# Patient Record
Sex: Male | Born: 1970 | Race: White | Hispanic: No | Marital: Married | Smoking: Never smoker
Health system: Southern US, Community
[De-identification: ages and names within clinical notes are randomized; demographics above are authoritative.]

## PROBLEM LIST (undated history)

## (undated) HISTORY — PX: HERNIA REPAIR: SHX51

## (undated) HISTORY — PX: APPENDECTOMY: SHX54

---

## 2021-05-13 ENCOUNTER — Inpatient Hospital Stay
Admission: EM | Admit: 2021-05-13 | Discharge: 2021-05-17 | DRG: 378 | Disposition: A | Discharge status: Home / Self Care | Payer: BC Managed Care – PPO | Attending: Internal Medicine | Admitting: Internal Medicine

## 2021-05-13 ENCOUNTER — Other Ambulatory Visit: Payer: Self-pay

## 2021-05-13 DIAGNOSIS — D123 Benign neoplasm of transverse colon: Secondary | ICD-10-CM | POA: Diagnosis present

## 2021-05-13 DIAGNOSIS — K5731 Diverticulosis of large intestine without perforation or abscess with bleeding: Secondary | ICD-10-CM | POA: Diagnosis not present

## 2021-05-13 DIAGNOSIS — Y838 Other surgical procedures as the cause of abnormal reaction of the patient, or of later complication, without mention of misadventure at the time of the procedure: Secondary | ICD-10-CM | POA: Diagnosis not present

## 2021-05-13 DIAGNOSIS — Z79899 Other long term (current) drug therapy: Secondary | ICD-10-CM

## 2021-05-13 DIAGNOSIS — K6289 Other specified diseases of anus and rectum: Secondary | ICD-10-CM

## 2021-05-13 DIAGNOSIS — I959 Hypotension, unspecified: Secondary | ICD-10-CM

## 2021-05-13 DIAGNOSIS — K922 Gastrointestinal hemorrhage, unspecified: Secondary | ICD-10-CM

## 2021-05-13 DIAGNOSIS — K91840 Postprocedural hemorrhage and hematoma of a digestive system organ or structure following a digestive system procedure: Secondary | ICD-10-CM

## 2021-05-13 DIAGNOSIS — Z20822 Contact with and (suspected) exposure to covid-19: Secondary | ICD-10-CM | POA: Diagnosis present

## 2021-05-13 DIAGNOSIS — K625 Hemorrhage of anus and rectum: Secondary | ICD-10-CM | POA: Diagnosis present

## 2021-05-13 DIAGNOSIS — Z9049 Acquired absence of other specified parts of digestive tract: Secondary | ICD-10-CM

## 2021-05-13 DIAGNOSIS — D125 Benign neoplasm of sigmoid colon: Secondary | ICD-10-CM | POA: Diagnosis present

## 2021-05-13 DIAGNOSIS — K644 Residual hemorrhoidal skin tags: Secondary | ICD-10-CM

## 2021-05-13 DIAGNOSIS — D62 Acute posthemorrhagic anemia: Secondary | ICD-10-CM | POA: Diagnosis not present

## 2021-05-13 DIAGNOSIS — K640 First degree hemorrhoids: Secondary | ICD-10-CM | POA: Diagnosis present

## 2021-05-13 DIAGNOSIS — K921 Melena: Secondary | ICD-10-CM | POA: Diagnosis not present

## 2021-05-13 DIAGNOSIS — Z6830 Body mass index (BMI) 30.0-30.9, adult: Secondary | ICD-10-CM

## 2021-05-13 DIAGNOSIS — E669 Obesity, unspecified: Secondary | ICD-10-CM | POA: Diagnosis present

## 2021-05-13 LAB — COMPREHENSIVE METABOLIC PANEL
ALT: 17 U/L (ref 0–44)
AST: 18 U/L (ref 15–41)
Albumin: 3.9 g/dL (ref 3.5–5.0)
Alkaline Phosphatase: 44 U/L (ref 38–126)
Anion gap: 5 (ref 5–15)
BUN: 17 mg/dL (ref 6–20)
CO2: 26 mmol/L (ref 22–32)
Calcium: 9.3 mg/dL (ref 8.9–10.3)
Chloride: 107 mmol/L (ref 98–111)
Creatinine, Ser: 0.95 mg/dL (ref 0.61–1.24)
GFR, Estimated: >60 mL/min (ref >60)
Glucose, Bld: 113 mg/dL — ABNORMAL HIGH (ref 70–99)
Potassium: 3.6 mmol/L (ref 3.5–5.1)
Sodium: 138 mmol/L (ref 135–145)
Total Bilirubin: 0.6 mg/dL (ref 0.3–1.2)
Total Protein: 6.2 g/dL — ABNORMAL LOW (ref 6.5–8.1)

## 2021-05-13 LAB — CBC
HCT: 40.5 % (ref 39.0–52.0)
Hemoglobin: 13.8 g/dL (ref 13.0–17.0)
MCH: 31.5 pg (ref 26.0–34.0)
MCHC: 34.1 g/dL (ref 30.0–36.0)
MCV: 92.5 fL (ref 80.0–100.0)
Platelets: 266 10*3/uL (ref 150–400)
RBC: 4.38 MIL/uL (ref 4.22–5.81)
RDW: 13 % (ref 11.5–15.5)
WBC: 9 10*3/uL (ref 4.0–10.5)
nRBC: 0 % (ref 0.0–0.2)

## 2021-05-13 MED ORDER — SODIUM CHLORIDE 0.9 % IV SOLN: INTRAVENOUS | Status: DC

## 2021-05-13 NOTE — ED Provider Notes (Signed)
[facility] Emergency Department Provider Note  ____________________________________________   Event Date/Time   First MD Initiated Contact with Patient 05/13/21 2258     (approximate)  I have reviewed the triage vital signs and the nursing notes.   HISTORY  Chief Complaint Abdominal Pain (PT COMPLAINING OF ABDOMINAL PAIN BELOW THE BELLY BUTTON, HAD 2 EPISODES OF BLEEDING TONIGHT 1 AT 9PM AND ONE AT 9:30 WAS ENOUGH TO TURN THE WATER IN THE STOOL DARK. NEVER HAPPENED BEFORE. EXCEPT A SMEAR FROM HEMORRHOIDS )    HPI Brian Wilson is a 50 y.o. male with no significant past medical history who presents to the emergency department GI bleeding that started tonight.  States he has had 2 episodes of maroon-colored bleeding with bowel movements tonight.  States that he felt very lightheaded, diaphoretic and was hypotensive with EMS with systolic pressures in the 90s.  Has been given IV fluids and reports feeling better.  States he is still having some abdominal pressure like he needs to have a bowel movement.  He had nausea but no vomiting.  No melena.  He is not on any blood thinners.  He has had scant amount of bleeding before from hemorrhoids but has never had a GI bleed.  Denies history of endoscopy or colonoscopy.  Denies fevers, chest pain, shortness of breath.  Has had previous appendectomy approximately 30 years ago.        History reviewed. No pertinent past medical history.  Patient Active Problem List   Diagnosis Date Noted   Rectal bleed 05/14/2021     History reviewed. No pertinent surgical history.  Prior to Admission medications   Medication Sig Start Date End Date Taking? Authorizing Provider  cholecalciferol (VITAMIN D) 25 MCG (1000 UNIT) tablet Take 1,000 Units by mouth daily.   Yes [provider]  Multiple Vitamin (MULTIVITAMIN WITH MINERALS) TABS tablet Take 1 tablet by mouth daily.   Yes [provider]  ZINC OXIDE PO  Take by mouth.   Yes [provider]    Allergies Patient has no known allergies.  History reviewed. No pertinent family history.  Social History Social History   Tobacco Use   Smoking status: Never   Smokeless tobacco: Never  Substance Use Topics   Alcohol use: Yes    Alcohol/week: 2.0 standard drinks    Types: 2 Glasses of wine per week    Comment: OCCAISIONALLY    Review of Systems Constitutional: No fever. Eyes: No visual changes. ENT: No sore throat. Cardiovascular: Denies chest pain. Respiratory: Denies shortness of breath. Gastrointestinal: No nausea, vomiting, diarrhea. Genitourinary: Negative for dysuria. Musculoskeletal: Negative for back pain. Skin: Negative for rash. Neurological: Negative for focal weakness or numbness.  ____________________________________________   PHYSICAL EXAM:  VITAL SIGNS: ED Triage Vitals  Enc Vitals Group     BP 05/13/21 2306 130/86     Pulse Rate 05/13/21 2306 86     Resp 05/13/21 2306 18     Temp 05/13/21 2306 98.2 F (36.8 C)     Temp Source 05/13/21 2306 Oral     SpO2 05/13/21 2306 97 %     Weight 05/13/21 2308 230 lb (104.3 kg)     Height 05/13/21 2308 '6\' 1"'$  (1.854 m)     Head Circumference --      Peak Flow --      Pain Score 05/13/21 2307 5     Pain Loc --      Pain Edu? --  Excl. in GC? --    CONSTITUTIONAL: Alert and oriented and responds appropriately to questions. Well-appearing; well-nourished HEAD: Normocephalic EYES: Conjunctivae clear, pupils appear equal, EOM appear intact ENT: normal nose; moist mucous membranes NECK: Supple, normal ROM CARD: RRR; S1 and S2 appreciated; no murmurs, no clicks, no rubs, no gallops RESP: Normal chest excursion without splinting or tachypnea; breath sounds clear and equal bilaterally; no wheezes, no rhonchi, no rales, no hypoxia or respiratory distress, speaking full sentences ABD/GI: Normal bowel sounds; non-distended; soft, mildly tender throughout the  lower abdomen, no rebound, no guarding, no peritoneal signs, no hepatosplenomegaly BACK: The back appears normal EXT: Normal ROM in all joints; no deformity noted, no edema; no cyanosis SKIN: Normal color for age and race; warm; no rash on exposed skin NEURO: Moves all extremities equally PSYCH: The patient's mood and manner are appropriate.  ____________________________________________   LABS (all labs ordered are listed, but only abnormal results are displayed)  Labs Reviewed  COMPREHENSIVE METABOLIC PANEL - Abnormal; Notable for the following components:      Result Value   Glucose, Bld 113 (*)    Total Protein 6.2 (*)    All other components within normal limits  RESP PANEL BY RT-PCR (FLU A&B, COVID) ARPGX2  CBC  PROTIME-INR  HIV ANTIBODY (ROUTINE TESTING W REFLEX)  HEMOGLOBIN AND HEMATOCRIT, BLOOD  HEMOGLOBIN AND HEMATOCRIT, BLOOD  TYPE AND SCREEN   ____________________________________________  EKG   EKG Interpretation  Date/Time:  Thursday May 13 2021 23:05:51 EDT Ventricular Rate:  74 PR Interval:  164 QRS Duration: 99 QT Interval:  370 QTC Calculation: 411 R Axis:   43 Text Interpretation: Sinus rhythm RSR' in V1 or V2, right VCD or RVH Confirmed by Pryor Curia [phone]) on 05/13/2021 11:16:41 PM        ____________________________________________  RADIOLOGY Jessie Foot Jumar Greenstreet, personally viewed and evaluated these images (plain radiographs) as part of my medical decision making, as well as reviewing the written report by the radiologist.  ED MD interpretation: CT scan shows possible area of bleeding at the diverticula in the ileum as well as hemorrhoids.  Official radiology report(s): CT Angio Abd/Pel W and/or Wo Contrast  Result Date: 05/14/2021 CLINICAL DATA:  GI bleeding, bilateral lower abdominal pain EXAM: CTA ABDOMEN AND PELVIS WITHOUT AND WITH CONTRAST TECHNIQUE: Multidetector CT imaging of the abdomen and pelvis was performed using the standard  protocol during bolus administration of intravenous contrast. Multiplanar reconstructed images and MIPs were obtained and reviewed to evaluate the vascular anatomy. CONTRAST:  167m OMNIPAQUE IOHEXOL 350 MG/ML SOLN COMPARISON:  None. FINDINGS: VASCULAR Aorta: Normal caliber aorta without aneurysm, dissection, vasculitis or significant stenosis. Celiac: Patent without evidence of aneurysm, dissection, vasculitis or significant stenosis. SMA: Patent. No evidence of aneurysm, dissection or vasculitis. No flow-limiting stenosis or occlusion. Renals: Single renal arteries bilaterally. Both renal arteries are patent without evidence of aneurysm, dissection, vasculitis, fibromuscular dysplasia or significant stenosis. IMA: Vessel normally opacified. No acute luminal abnormality. No significant stenosis or occlusion. No aneurysm or ectasia or features of vasculitis. Inflow: Patent without evidence of aneurysm, dissection, vasculitis or significant stenosis. Proximal Outflow: Bilateral common femoral and visualized portions of the superficial and profunda femoral arteries are patent without evidence of aneurysm, dissection, vasculitis or significant stenosis. Veins: No obvious venous abnormality within the limitations of this arterial phase study. Review of the MIP images confirms the above findings. NON-VASCULAR Lower chest: Atelectatic changes in the otherwise clear lung bases. Hepatobiliary: No worrisome focal liver lesions. Smooth liver  surface contour. Normal hepatic attenuation. Normal gallbladder and biliary tree. Pancreas: No pancreatic ductal dilatation or surrounding inflammatory changes. Spleen: Normal in size. No concerning splenic lesions. Adrenals/Urinary Tract: Normal adrenal glands. Kidneys are normally located with symmetric enhancement. No suspicious renal lesion, urolithiasis or hydronephrosis. Postsurgical changes and indentation of the right anterolateral bladder, likely related to a right inguinal  hernia repair. Stomach/Bowel: Distal esophagus, stomach and duodenal sweep are unremarkable. No small bowel wall thickening or dilatation. Few small distal ileal noninflamed diverticula. Small focus of arterial contrast enhancement associated with the right-sided cecal diverticulum July 16, 2061) which appears stable on the more delayed venous phase imaging, could reflect enhancement or minimal extravasation versus artifact due to adjacent bowel gas. Additional numerous colonic diverticula throughout the colonic segments without worrisome focal Peri diverticular inflammation colonic thickening or dilatation. Fluid-filled appearance of the colon. Serpentine hyperattenuation towards the level of the anorectal verge on the arterial phase of imaging 1997-07-16) appears less pronounced on the venous phase imaging. Appearance suggestive of hemorrhoidal enhancement with small site of extravasation less favored. Lymphatic: No significant vascular findings are present. No enlarged abdominal or pelvic lymph nodes. Reproductive: Vasectomy clips noted bilaterally. The prostate and seminal vesicles are unremarkable. Other: Postsurgical changes in the right inguinal region, possible hernia repair. Additional vertical midline incision anteriorly. Focal subcutaneous nodular thickening in the left flank, could reflect contusive change or injectable use. Musculoskeletal: Multilevel degenerative changes are present in the imaged portions of the spine, hips and pelvis. No acute osseous abnormality or suspicious osseous lesion. IMPRESSION: VASCULAR Two sites of questionable contrast blush including a small focus of arterial enhancement at the level of a cecal diverticulum persisting on the venous phase of imaging. Could reflect bleeding at a noninflamed diverticulum versus artifact due to adjacent bowel gas. Second site is is a more serpentine focus of enhancement diffusing on the venous phase imaging at the level of the anorectal verge. Such an  appearance is fairly typical for hemorrhoidal enhancement with small bleed less favored though could correlate with rectal exam. No other acute or conspicuous vascular findings. NON-VASCULAR Diffusely fluid-filled appearance of the colon, correlate for rapid transit state/diarrheal illness. Postsurgical changes in the right inguinal region with scarring about the right anterolateral bladder, correlate with surgical history for possible prior hernia repair. Prior vasectomy. Electronically Signed   By: Lovena Le M.D.   On: 05/14/2021 00:36    ____________________________________________   PROCEDURES  Procedure(s) performed (including Critical Care):  Procedures  CRITICAL CARE Performed by: Cyril Mourning Asuncion Tapscott   Total critical care time: 45 minutes  Critical care time was exclusive of separately billable procedures and treating other patients.  Critical care was necessary to treat or prevent imminent or life-threatening deterioration.  Critical care was time spent personally by me on the following activities: development of treatment plan with patient and/or surrogate as well as nursing, discussions with consultants, evaluation of patient's response to treatment, examination of patient, obtaining history from patient or surrogate, ordering and performing treatments and interventions, ordering and review of laboratory studies, ordering and review of radiographic studies, pulse oximetry and re-evaluation of patient's condition.  ____________________________________________   INITIAL IMPRESSION / ASSESSMENT AND PLAN / ED COURSE  As part of my medical decision making, I reviewed the following data within the [place] History obtained from family, Nursing notes reviewed and incorporated, Labs reviewed , EKG interpreted , Old chart reviewed, Radiograph reviewed , Discussed with admitting physician , and Notes from prior ED visits  Patient here with lower GI bleed.   Differential includes diverticulosis, diverticulitis, colitis, AVM, hemorrhoids.  Patient shows me pictures on his phone of a large amount of maroon-colored blood in the toilet bowl tonight without any obvious stool.  He was hypotensive with EMS but this has improved.  Tender in the lower abdomen but no guarding, rebound or peritoneal signs.  Will obtain labs, CTA of the abdomen and pelvis.  We will continue IV hydration.  Anticipate admission.  ED PROGRESS  Patient CT scan shows areas concerning for active bleeding the ileum where there are noninflamed diverticuli but also possible hemorrhoids as well.  He has had another bloody bowel movement here in the emergency department however remains hemodynamically stable.  Hemoglobin is normal.  No thrombocytopenia or coagulopathy.  Will discuss with medicine for admission.  1:27 AM  Discussed patient's case with hospitalist, Dr. Damita Dunnings.  I have recommended admission and patient (and family if present) agree with this plan. Admitting physician will place admission orders.   Hospitalist plans on consulting vascular surgery.  I reviewed all nursing notes, vitals, pertinent previous records and reviewed/interpreted all EKGs, lab and urine results, imaging (as available).  ____________________________________________   FINAL CLINICAL IMPRESSION(S) / ED DIAGNOSES  Final diagnoses:  Lower GI bleed  Hypotension, unspecified hypotension type     ED Discharge Orders     None       *Please note:  Darold Basta was evaluated in Emergency Department on 05/14/2021 for the symptoms described in the history of present illness. He was evaluated in the context of the global COVID-19 pandemic, which necessitated consideration that the patient might be at risk for infection with the SARS-CoV-2 virus that causes COVID-19. Institutional protocols and algorithms that pertain to the evaluation of patients at risk for COVID-19 are in a state of rapid change based  on information released by regulatory bodies including the CDC and federal and state organizations. These policies and algorithms were followed during the patient's care in the ED.  Some ED evaluations and interventions may be delayed as a result of limited staffing during and the pandemic.*   Note:  This document was prepared using Dragon voice recognition software and may include unintentional dictation errors.    Breckyn Troyer, Delice Bison, DO 05/14/21 [phone]

## 2021-05-13 NOTE — ED Triage Notes (Signed)
COMPLAINING OF HAVING LOWER ABDOMINAL PRESSURE LIKE HE IS GOING TO HAVE A BOWEL MOVEMENT

## 2021-05-13 NOTE — ED Notes (Signed)
LABS, DRAWN MD IN TO SEE PATIENT

## 2021-05-14 ENCOUNTER — Inpatient Hospital Stay: Payer: BC Managed Care – PPO | Admitting: Anesthesiology

## 2021-05-14 ENCOUNTER — Encounter: Payer: Self-pay | Admitting: Internal Medicine

## 2021-05-14 ENCOUNTER — Emergency Department: Payer: BC Managed Care – PPO

## 2021-05-14 ENCOUNTER — Encounter
Admission: EM | Disposition: A | Discharge status: Home / Self Care | Payer: Self-pay | Source: Home / Self Care | Attending: Internal Medicine

## 2021-05-14 DIAGNOSIS — Z6830 Body mass index (BMI) 30.0-30.9, adult: Secondary | ICD-10-CM | POA: Diagnosis not present

## 2021-05-14 DIAGNOSIS — K921 Melena: Secondary | ICD-10-CM | POA: Diagnosis present

## 2021-05-14 DIAGNOSIS — K625 Hemorrhage of anus and rectum: Secondary | ICD-10-CM | POA: Diagnosis not present

## 2021-05-14 DIAGNOSIS — D123 Benign neoplasm of transverse colon: Secondary | ICD-10-CM | POA: Diagnosis present

## 2021-05-14 DIAGNOSIS — Z79899 Other long term (current) drug therapy: Secondary | ICD-10-CM | POA: Diagnosis not present

## 2021-05-14 DIAGNOSIS — D62 Acute posthemorrhagic anemia: Secondary | ICD-10-CM | POA: Diagnosis not present

## 2021-05-14 DIAGNOSIS — Z20822 Contact with and (suspected) exposure to covid-19: Secondary | ICD-10-CM | POA: Diagnosis present

## 2021-05-14 DIAGNOSIS — E669 Obesity, unspecified: Secondary | ICD-10-CM | POA: Diagnosis present

## 2021-05-14 DIAGNOSIS — Z9049 Acquired absence of other specified parts of digestive tract: Secondary | ICD-10-CM | POA: Diagnosis not present

## 2021-05-14 DIAGNOSIS — K640 First degree hemorrhoids: Secondary | ICD-10-CM | POA: Diagnosis present

## 2021-05-14 DIAGNOSIS — Y838 Other surgical procedures as the cause of abnormal reaction of the patient, or of later complication, without mention of misadventure at the time of the procedure: Secondary | ICD-10-CM | POA: Diagnosis not present

## 2021-05-14 DIAGNOSIS — D125 Benign neoplasm of sigmoid colon: Secondary | ICD-10-CM | POA: Diagnosis present

## 2021-05-14 DIAGNOSIS — K922 Gastrointestinal hemorrhage, unspecified: Secondary | ICD-10-CM | POA: Diagnosis not present

## 2021-05-14 DIAGNOSIS — K635 Polyp of colon: Secondary | ICD-10-CM | POA: Diagnosis not present

## 2021-05-14 DIAGNOSIS — K644 Residual hemorrhoidal skin tags: Secondary | ICD-10-CM | POA: Diagnosis present

## 2021-05-14 DIAGNOSIS — I959 Hypotension, unspecified: Secondary | ICD-10-CM | POA: Diagnosis present

## 2021-05-14 DIAGNOSIS — K91840 Postprocedural hemorrhage and hematoma of a digestive system organ or structure following a digestive system procedure: Secondary | ICD-10-CM | POA: Diagnosis not present

## 2021-05-14 DIAGNOSIS — K5731 Diverticulosis of large intestine without perforation or abscess with bleeding: Secondary | ICD-10-CM | POA: Diagnosis present

## 2021-05-14 HISTORY — PX: COLONOSCOPY: SHX5424

## 2021-05-14 LAB — TYPE AND SCREEN
ABO/RH(D): O POS
Antibody Screen: NEGATIVE

## 2021-05-14 LAB — PROTIME-INR
INR: 1 (ref 0.8–1.2)
Prothrombin Time: 13.1 seconds (ref 11.4–15.2)

## 2021-05-14 LAB — RESP PANEL BY RT-PCR (FLU A&B, COVID) ARPGX2
Influenza A by PCR: NEGATIVE
Influenza B by PCR: NEGATIVE
SARS Coronavirus 2 by RT PCR: NEGATIVE

## 2021-05-14 LAB — HEMOGLOBIN AND HEMATOCRIT, BLOOD
HCT: 38.9 % — ABNORMAL LOW (ref 39.0–52.0)
HCT: 38 % — ABNORMAL LOW (ref 39.0–52.0)
Hemoglobin: 13.4 g/dL (ref 13.0–17.0)
Hemoglobin: 13 g/dL (ref 13.0–17.0)

## 2021-05-14 LAB — HIV ANTIBODY (ROUTINE TESTING W REFLEX): HIV Screen 4th Generation wRfx: NONREACTIVE

## 2021-05-14 SURGERY — COLONOSCOPY
Anesthesia: Monitor Anesthesia Care

## 2021-05-14 MED ORDER — PROPOFOL 10 MG/ML IV BOLUS
INTRAVENOUS | Status: DC | PRN
Start: 2021-05-14 — End: 2021-05-14
  Administered 2021-05-14: 100 mg via INTRAVENOUS
  Administered 2021-05-14: 40 mg via INTRAVENOUS
  Administered 2021-05-14: 60 mg via INTRAVENOUS
  Administered 2021-05-14 (×4): 40 mg via INTRAVENOUS

## 2021-05-14 MED ORDER — SODIUM CHLORIDE 0.9 % IV SOLN: INTRAVENOUS | Status: DC

## 2021-05-14 MED ORDER — ACETAMINOPHEN 325 MG PO TABS: 650.0000 mg | ORAL_TABLET | Freq: Four times a day (QID) | ORAL | Status: DC | PRN

## 2021-05-14 MED ORDER — PEG 3350-KCL-NA BICARB-NACL 420 G PO SOLR: 4000.0000 mL | Freq: Once | ORAL | Status: DC

## 2021-05-14 MED ORDER — IOHEXOL 350 MG/ML SOLN
100.0000 mL | Freq: Once | INTRAVENOUS | Status: AC | PRN
  Administered 2021-05-14: 100 mL via INTRAVENOUS

## 2021-05-14 MED ORDER — LIDOCAINE HCL (CARDIAC) PF 100 MG/5ML IV SOSY
PREFILLED_SYRINGE | INTRAVENOUS | Status: DC | PRN
  Administered 2021-05-14: 40 mg via INTRAVENOUS

## 2021-05-14 MED ORDER — PANTOPRAZOLE SODIUM 40 MG IV SOLR
40.0000 mg | INTRAVENOUS | Status: DC
  Administered 2021-05-14 – 2021-05-17 (×4): 40 mg via INTRAVENOUS
  Filled 2021-05-14 (×2): qty 40

## 2021-05-14 MED ORDER — ONDANSETRON HCL 4 MG/2ML IJ SOLN
4.0000 mg | Freq: Four times a day (QID) | INTRAMUSCULAR | Status: DC | PRN
  Administered 2021-05-14: 4 mg via INTRAVENOUS
  Filled 2021-05-14: qty 2

## 2021-05-14 MED ORDER — PEG 3350-KCL-NA BICARB-NACL 420 G PO SOLR
4000.0000 mL | Freq: Once | ORAL | Status: AC
  Administered 2021-05-14: 4000 mL via ORAL
  Filled 2021-05-14: qty 4000

## 2021-05-14 MED ORDER — PROPOFOL 500 MG/50ML IV EMUL
INTRAVENOUS | Status: AC
  Filled 2021-05-14: qty 50

## 2021-05-14 MED ORDER — PHENYLEPHRINE HCL (PRESSORS) 10 MG/ML IV SOLN
INTRAVENOUS | Status: DC | PRN
  Administered 2021-05-14 (×4): 200 ug via INTRAVENOUS
  Administered 2021-05-14: 100 ug via INTRAVENOUS
  Administered 2021-05-14: 150 ug via INTRAVENOUS

## 2021-05-14 MED ORDER — ACETAMINOPHEN 650 MG RE SUPP: 650.0000 mg | Freq: Four times a day (QID) | RECTAL | Status: DC | PRN

## 2021-05-14 MED ORDER — ONDANSETRON HCL 4 MG PO TABS: 4.0000 mg | ORAL_TABLET | Freq: Four times a day (QID) | ORAL | Status: DC | PRN

## 2021-05-14 NOTE — Anesthesia Postprocedure Evaluation (Signed)
Anesthesia Post Note  Patient: Brian Wilson  Procedure(s) Performed: COLONOSCOPY  Patient location during evaluation: PACU Anesthesia Type: MAC Level of consciousness: awake and alert Pain management: pain level controlled Vital Signs Assessment: post-procedure vital signs reviewed and stable Respiratory status: spontaneous breathing Cardiovascular status: blood pressure returned to baseline Postop Assessment: no headache Anesthetic complications: no   No notable events documented.   Last Vitals:  Vitals:   05/14/21 1330 05/14/21 1338  BP: 127/82 127/82  Pulse: 80 72  Resp: 18 15  Temp:    SpO2: 98% 99%    Last Pain:  Vitals:   05/14/21 1340  TempSrc:   PainSc: 0-No pain                 Milinda Pointer

## 2021-05-14 NOTE — H&P (Signed)
History and Physical    Brian Wilson [id number] DOB: 1971-02-27 DOA: 05/13/2021  PCP: Sallee Provencal, MD   Patient coming from: Home  I have personally briefly reviewed patient's old medical records in [place]  Chief Complaint: Rectal bleeding  HPI: Brian Wilson is a 50 y.o. male with a history of hemorrhoids who presents to the ED with onset of GI bleeding on the night of arrival.  Patient had 2 large maroon-colored bowel movements and became lightheaded and diaphoretic.  He denied shortness of breath palpitations or associated chest pain.  Denies abdominal pain but admits to feeling like he needs to have another bowel movement.  Denies nausea or vomiting.  He is not on anti-inflammatories nor taking blood thinners.  No prior upper or lower endoscopies.  He was previously in his usual state of health.  EMS reported systolic blood pressure in the 90s and he was bolused prior to arrival  ED course: By arrival BP 130/86 and pulse 86 with otherwise normal vitals.   Blood work with hemoglobin 13.8 and INR of 1, otherwise unremarkable EKG, personally viewed and interpreted: Sinus rhythm at 74 with nonspecific ST-T wave changes  Imaging: CT abdomen and pelvis with and without contrast with 2 questionable areas of contrast blush as described below:  Two sites of questionable contrast blush including a small focus of arterial enhancement at the level of a cecal diverticulum persisting on the venous phase of imaging. Could reflect bleeding at a noninflamed diverticulum versus artifact due to adjacent bowel gas.   Second site is is a more serpentine focus of enhancement diffusing on the venous phase imaging at the level of the anorectal verge. Such an appearance is fairly typical for hemorrhoidal enhancement with small bleed less favored though could correlate with rectal exam.  Patient was typed and crossed and given another fluid bolus.  Hospitalist consulted for  admission.  Review of Systems: As per HPI otherwise all other systems on review of systems negative.    History reviewed. No pertinent past medical history.  History reviewed. No pertinent surgical history.   reports that he has never smoked. He has never used smokeless tobacco. He reports current alcohol use of about 2.0 standard drinks of alcohol per week. No history on file for drug use.  No Known Allergies  History reviewed. No pertinent family history.    Prior to Admission medications   Medication Sig Start Date End Date Taking? Authorizing Provider  cholecalciferol (VITAMIN D) 25 MCG (1000 UNIT) tablet Take 1,000 Units by mouth daily.   Yes [provider]  Multiple Vitamin (MULTIVITAMIN WITH MINERALS) TABS tablet Take 1 tablet by mouth daily.   Yes [provider]  ZINC OXIDE PO Take by mouth.   Yes [provider]    Physical Exam: Vitals:   05/13/21 2306 05/13/21 2308 05/14/21 0000 05/14/21 0100  BP: 130/86  129/80 139/90  Pulse: 86  81 86  Resp: 18   18  Temp: 98.2 F (36.8 C)  98.2 F (36.8 C)   TempSrc: Oral     SpO2: 97%  98% 99%  Weight:  104.3 kg    Height:  '6\' 1"'$  (1.854 m)       Vitals:   05/13/21 2306 05/13/21 2308 05/14/21 0000 05/14/21 0100  BP: 130/86  129/80 139/90  Pulse: 86  81 86  Resp: 18   18  Temp: 98.2 F (36.8 C)  98.2 F (36.8 C)   TempSrc: Oral  SpO2: 97%  98% 99%  Weight:  104.3 kg    Height:  '6\' 1"'$  (1.854 m)        Constitutional: Alert and oriented x 3 . Not in any apparent distress HEENT:      Head: Normocephalic and atraumatic.         Eyes: PERLA, EOMI, Conjunctivae are normal. Sclera is non-icteric.       Mouth/Throat: Mucous membranes are moist.       Neck: Supple with no signs of meningismus. Cardiovascular: Regular rate and rhythm. No murmurs, gallops, or rubs. 2+ symmetrical distal pulses are present . No JVD. No LE edema Respiratory: Respiratory effort normal .Lungs sounds clear  bilaterally. No wheezes, crackles, or rhonchi.  Gastrointestinal: Soft, non tender, and non distended with positive bowel sounds.  Genitourinary: No CVA tenderness. Musculoskeletal: Nontender with normal range of motion in all extremities. No cyanosis, or erythema of extremities. Neurologic:  Face is symmetric. Moving all extremities. No gross focal neurologic deficits . Skin: Skin is warm, dry.  No rash or ulcers Psychiatric: Mood and affect are normal    Labs on Admission: I have personally reviewed following labs and imaging studies  CBC: Recent Labs  Lab 05/13/21 2311  WBC 9.0  HGB 13.8  HCT 40.5  MCV 92.5  PLT 123456   Basic Metabolic Panel: Recent Labs  Lab 05/13/21 2311  NA 138  K 3.6  CL 107  CO2 26  GLUCOSE 113*  BUN 17  CREATININE 0.95  CALCIUM 9.3   GFR: Estimated Creatinine Clearance: 120.6 mL/min (by C-G formula based on SCr of 0.95 mg/dL). Liver Function Tests: Recent Labs  Lab 05/13/21 2311  AST 18  ALT 17  ALKPHOS 44  BILITOT 0.6  PROT 6.2*  ALBUMIN 3.9   No results for input(s): LIPASE, AMYLASE in the last 168 hours. No results for input(s): AMMONIA in the last 168 hours. Coagulation Profile: Recent Labs  Lab 05/13/21 2311  INR 1.0   Cardiac Enzymes: No results for input(s): CKTOTAL, CKMB, CKMBINDEX, TROPONINI in the last 168 hours. BNP (last 3 results) No results for input(s): PROBNP in the last 8760 hours. HbA1C: No results for input(s): HGBA1C in the last 72 hours. CBG: No results for input(s): GLUCAP in the last 168 hours. Lipid Profile: No results for input(s): CHOL, HDL, LDLCALC, TRIG, CHOLHDL, LDLDIRECT in the last 72 hours. Thyroid Function Tests: No results for input(s): TSH, T4TOTAL, FREET4, T3FREE, THYROIDAB in the last 72 hours. Anemia Panel: No results for input(s): VITAMINB12, FOLATE, FERRITIN, TIBC, IRON, RETICCTPCT in the last 72 hours. Urine analysis: No results found for: COLORURINE, APPEARANCEUR, LABSPEC, PHURINE,  GLUCOSEU, HGBUR, BILIRUBINUR, KETONESUR, PROTEINUR, UROBILINOGEN, NITRITE, LEUKOCYTESUR  Radiological Exams on Admission: CT Angio Abd/Pel W and/or Wo Contrast  Result Date: 05/14/2021 CLINICAL DATA:  GI bleeding, bilateral lower abdominal pain EXAM: CTA ABDOMEN AND PELVIS WITHOUT AND WITH CONTRAST TECHNIQUE: Multidetector CT imaging of the abdomen and pelvis was performed using the standard protocol during bolus administration of intravenous contrast. Multiplanar reconstructed images and MIPs were obtained and reviewed to evaluate the vascular anatomy. CONTRAST:  131m OMNIPAQUE IOHEXOL 350 MG/ML SOLN COMPARISON:  None. FINDINGS: VASCULAR Aorta: Normal caliber aorta without aneurysm, dissection, vasculitis or significant stenosis. Celiac: Patent without evidence of aneurysm, dissection, vasculitis or significant stenosis. SMA: Patent. No evidence of aneurysm, dissection or vasculitis. No flow-limiting stenosis or occlusion. Renals: Single renal arteries bilaterally. Both renal arteries are patent without evidence of aneurysm, dissection, vasculitis, fibromuscular dysplasia or significant  stenosis. IMA: Vessel normally opacified. No acute luminal abnormality. No significant stenosis or occlusion. No aneurysm or ectasia or features of vasculitis. Inflow: Patent without evidence of aneurysm, dissection, vasculitis or significant stenosis. Proximal Outflow: Bilateral common femoral and visualized portions of the superficial and profunda femoral arteries are patent without evidence of aneurysm, dissection, vasculitis or significant stenosis. Veins: No obvious venous abnormality within the limitations of this arterial phase study. Review of the MIP images confirms the above findings. NON-VASCULAR Lower chest: Atelectatic changes in the otherwise clear lung bases. Hepatobiliary: No worrisome focal liver lesions. Smooth liver surface contour. Normal hepatic attenuation. Normal gallbladder and biliary tree. Pancreas:  No pancreatic ductal dilatation or surrounding inflammatory changes. Spleen: Normal in size. No concerning splenic lesions. Adrenals/Urinary Tract: Normal adrenal glands. Kidneys are normally located with symmetric enhancement. No suspicious renal lesion, urolithiasis or hydronephrosis. Postsurgical changes and indentation of the right anterolateral bladder, likely related to a right inguinal hernia repair. Stomach/Bowel: Distal esophagus, stomach and duodenal sweep are unremarkable. No small bowel wall thickening or dilatation. Few small distal ileal noninflamed diverticula. Small focus of arterial contrast enhancement associated with the right-sided cecal diverticulum 07/12/61) which appears stable on the more delayed venous phase imaging, could reflect enhancement or minimal extravasation versus artifact due to adjacent bowel gas. Additional numerous colonic diverticula throughout the colonic segments without worrisome focal Peri diverticular inflammation colonic thickening or dilatation. Fluid-filled appearance of the colon. Serpentine hyperattenuation towards the level of the anorectal verge on the arterial phase of imaging Jul 12, 1997) appears less pronounced on the venous phase imaging. Appearance suggestive of hemorrhoidal enhancement with small site of extravasation less favored. Lymphatic: No significant vascular findings are present. No enlarged abdominal or pelvic lymph nodes. Reproductive: Vasectomy clips noted bilaterally. The prostate and seminal vesicles are unremarkable. Other: Postsurgical changes in the right inguinal region, possible hernia repair. Additional vertical midline incision anteriorly. Focal subcutaneous nodular thickening in the left flank, could reflect contusive change or injectable use. Musculoskeletal: Multilevel degenerative changes are present in the imaged portions of the spine, hips and pelvis. No acute osseous abnormality or suspicious osseous lesion. IMPRESSION: VASCULAR Two sites of  questionable contrast blush including a small focus of arterial enhancement at the level of a cecal diverticulum persisting on the venous phase of imaging. Could reflect bleeding at a noninflamed diverticulum versus artifact due to adjacent bowel gas. Second site is is a more serpentine focus of enhancement diffusing on the venous phase imaging at the level of the anorectal verge. Such an appearance is fairly typical for hemorrhoidal enhancement with small bleed less favored though could correlate with rectal exam. No other acute or conspicuous vascular findings. NON-VASCULAR Diffusely fluid-filled appearance of the colon, correlate for rapid transit state/diarrheal illness. Postsurgical changes in the right inguinal region with scarring about the right anterolateral bladder, correlate with surgical history for possible prior hernia repair. Prior vasectomy. Electronically Signed   By: Lovena Le M.D.   On: 05/14/2021 00:36     Assessment/Plan 50 year old male with history of hemorrhoids who p presenting with onset of large maroon-colored bowel movements associated with lightheadedness and diaphoresis with EMS recording systolic BP in the 0000000.    Rectal bleeding/hematochezia History of hemorrhoids Hypotension with EMS - Hypotension with EMS was fluid responsive - Hemoglobin 13.8 with INR of 1.0 - No history of blood thinners or NSAIDs - CTA showing 2 areas of contrast blush suggesting possible rectal bleeding diverticulum versus hemorrhoidal - Discussed with vascular Dr Lucky Cowboy who opines that bleeding  site appears to be more hemorrhoidal - Discussed with GI, Dr Bonna Gains who would like  to start bowel prep for colonoscopy in the am, NPO after 10 am - Colonoscopy prep ordered and communicated to nurse - Continue serial H&H -Patient was given a dose of Protonix IV - Continue hemodynamic monitoring  DVT prophylaxis: SCD Code Status: full code  Family Communication:  none  Disposition Plan: Back to  previous home environment Consults called: vascular and GI  Status:At the time of admission, it appears that the appropriate admission status for this patient is INPATIENT. This is judged to be reasonable and necessary in order to provide the required intensity of service to ensure the patient's safety given the presenting symptoms, physical exam findings, and initial radiographic and laboratory data in the context of their  Comorbid conditions.   Patient requires inpatient status due to high intensity of service, high risk for further deterioration and high frequency of surveillance required.   I certify that at the point of admission it is my clinical judgment that the patient will require inpatient hospital care spanning beyond [place] MD [facility]     05/14/2021, 1:58 AM

## 2021-05-14 NOTE — Transfer of Care (Signed)
Immediate Anesthesia Transfer of Care Note  Patient: Brian Wilson  Procedure(s) Performed: COLONOSCOPY  Patient Location: Endoscopy Unit  Anesthesia Type:MAC  Level of Consciousness: awake, alert  and oriented  Airway & Oxygen Therapy: Patient Spontanous Breathing and Patient connected to nasal cannula oxygen  Post-op Assessment: Report given to RN, Post -op Vital signs reviewed and stable and Patient moving all extremities  Post vital signs: Reviewed and stable  Last Vitals:  Vitals Value Taken Time  BP 117/72 05/14/21 1320  Temp    Pulse 88 05/14/21 1321  Resp 17 05/14/21 1321  SpO2 99 % 05/14/21 1321  Vitals shown include unvalidated device data.  Last Pain:  Vitals:   05/14/21 1154  TempSrc: Temporal  PainSc: 0-No pain      Patients Stated Pain Goal: 2 (50/93/26 7124)  Complications: No notable events documented.

## 2021-05-14 NOTE — Consult Note (Signed)
[facility] Consult Note  MRN : [id number]  Brian Wilson is a 50 y.o. (1971-09-12) male who presents with chief complaint of  Chief Complaint  Patient presents with   Abdominal Pain    PT COMPLAINING OF ABDOMINAL PAIN BELOW THE BELLY BUTTON, HAD 2 EPISODES OF BLEEDING TONIGHT 1 AT 9PM AND ONE AT 9:30 WAS ENOUGH TO TURN THE WATER IN THE STOOL DARK. NEVER HAPPENED BEFORE. EXCEPT A SMEAR FROM HEMORRHOIDS    History of Present Illness:  Brian Wilson is a 50 year old male with a history of hemorrhoids who presents to the ED with onset of GI bleeding on the night of arrival.    Patient had two large maroon-colored bowel movements and became lightheaded and diaphoretic. He denied shortness of breath palpitations or associated chest pain. Denies abdominal pain but admits to feeling like he needs to have another bowel movement.  Denies nausea or vomiting. He is not on anti-inflammatories nor taking blood thinners. No prior upper or lower endoscopies. He was previously in his usual state of health. EMS reported systolic blood pressure in the 90s and he was bolused prior to arrival   Imaging: CT abdomen and pelvis with and without contrast with 2 questionable areas of contrast blush as described below  Vascular Surgery was consulted by Dr. Damita Dunnings for possible endovascular intervention.   Current Facility-Administered Medications  Medication Dose Route Frequency Provider Last Rate Last Admin   0.9 %  sodium chloride infusion   Intravenous Continuous Athena Masse, MD 125 mL/hr at 05/14/21 0506 New Bag at 05/14/21 0506   0.9 %  sodium chloride infusion   Intravenous Continuous Athena Masse, MD 20 mL/hr at 05/14/21 1157 New Bag at 05/14/21 1157   [MAR Hold] acetaminophen (TYLENOL) tablet 650 mg  650 mg Oral Q6H PRN Athena Masse, MD       Or   Doug Sou Hold] acetaminophen (TYLENOL) suppository 650 mg  650 mg Rectal Q6H PRN Athena Masse, MD       [MAR Hold]  ondansetron [facility]) tablet 4 mg  4 mg Oral Q6H PRN Athena Masse, MD       Or   Doug Sou Hold] ondansetron [facility]) injection 4 mg  4 mg Intravenous Q6H PRN Athena Masse, MD   4 mg at 05/14/21 0934   [MAR Hold] pantoprazole (PROTONIX) injection 40 mg  40 mg Intravenous Q24H Athena Masse, MD   40 mg at 05/14/21 0548   History reviewed. No pertinent past medical history.  Past Surgical History:  Procedure Laterality Date   APPENDECTOMY     HERNIA REPAIR     Social History Social History   Tobacco Use   Smoking status: Never   Smokeless tobacco: Never  Substance Use Topics   Alcohol use: Yes    Alcohol/week: 2.0 standard drinks    Types: 2 Glasses of wine per week    Comment: OCCAISIONALLY   Family History History reviewed. No pertinent family history. Denies family history of PAD, venous or bleeding / clotting disorders.  No Known Allergies  REVIEW OF SYSTEMS (Negative unless checked)  Constitutional: '[]'$ Weight loss  '[]'$ Fever  '[]'$ Chills Cardiac: '[]'$ Chest pain   '[]'$ Chest pressure   '[]'$ Palpitations   '[]'$ Shortness of breath when laying flat   '[]'$ Shortness of breath at rest   '[]'$ Shortness of breath with exertion. Vascular:  '[]'$ Pain in legs with walking   '[]'$ Pain in legs at rest   '[]'$ Pain in legs when laying  flat   '[]'$ Claudication   '[]'$ Pain in feet when walking  '[]'$ Pain in feet at rest  '[]'$ Pain in feet when laying flat   '[]'$ History of DVT   '[]'$ Phlebitis   '[]'$ Swelling in legs   '[]'$ Varicose veins   '[]'$ Non-healing ulcers Pulmonary:   '[]'$ Uses home oxygen   '[]'$ Productive cough   '[]'$ Hemoptysis   '[]'$ Wheeze  '[]'$ COPD   '[]'$ Asthma Neurologic:  '[]'$ Dizziness  '[]'$ Blackouts   '[]'$ Seizures   '[]'$ History of stroke   '[]'$ History of TIA  '[]'$ Aphasia   '[]'$ Temporary blindness   '[]'$ Dysphagia   '[]'$ Weakness or numbness in arms   '[]'$ Weakness or numbness in legs Musculoskeletal:  '[]'$ Arthritis   '[]'$ Joint swelling   '[]'$ Joint pain   '[]'$ Low back pain Hematologic:  '[]'$ Easy bruising  '[]'$ Easy bleeding   '[]'$ Hypercoagulable state   '[]'$ Anemic   '[]'$ Hepatitis Gastrointestinal:  '[]'$ Blood in stool   '[]'$ Vomiting blood  '[]'$ Gastroesophageal reflux/heartburn   '[]'$ Difficulty swallowing. Genitourinary:  '[]'$ Chronic kidney disease   '[]'$ Difficult urination  '[]'$ Frequent urination  '[]'$ Burning with urination   '[]'$ Blood in urine Skin:  '[]'$ Rashes   '[]'$ Ulcers   '[]'$ Wounds Psychological:  '[]'$ History of anxiety   '[]'$  History of major depression.  Positive for blood in stool and hemorrhoids   Physical Examination  Vitals:   05/14/21 0500 05/14/21 0600 05/14/21 0933 05/14/21 1154  BP: 112/72 126/75 122/80 (!) 137/92  Pulse: 77 67 79 75  Resp: '17 14 18 18  '$ Temp:   97.7 F (36.5 C) (!) 96.7 F (35.9 C)  TempSrc:   Oral Temporal  SpO2: 97% 99% 99% 100%  Weight:    104.3 kg  Height:    '6\' 1"'$  (1.854 m)   Body mass index is 30.34 kg/m. Gen:  WD/WN, NAD Head: [place]/AT, No temporalis wasting. Prominent temp pulse not noted. Ear/Nose/Throat: Hearing grossly intact, nares w/o erythema or drainage, oropharynx w/o Erythema/Exudate Eyes: Sclera non-icteric, conjunctiva clear Neck: Trachea midline.  No JVD.  Pulmonary:  Good air movement, respirations not labored, equal bilaterally.  Cardiac: RRR, normal S1, S2. Vascular:  Vessel Right Left  Radial Palpable Palpable  Ulnar Palpable Palpable  Brachial Palpable Palpable  Carotid Palpable, without bruit Palpable, without bruit  Aorta Not palpable N/A  Femoral Palpable Palpable  Popliteal Palpable Palpable  PT Palpable Palpable  DP Palpable Palpable   Gastrointestinal: soft, non-tender/non-distended. No guarding/reflex.  Musculoskeletal: M/S 5/5 throughout.  Extremities without ischemic changes.  No deformity or atrophy. No edema. Neurologic: Sensation grossly intact in extremities.  Symmetrical.  Speech is fluent. Motor exam as listed above. Psychiatric: Judgment intact, Mood & affect appropriate for pt's clinical situation. Dermatologic: No rashes or ulcers noted.  No cellulitis or open wounds. Lymph : No Cervical,  Axillary, or Inguinal lymphadenopathy.  CBC Lab Results  Component Value Date   WBC 9.0 05/13/2021   HGB 13.4 05/14/2021   HCT 38.9 (L) 05/14/2021   MCV 92.5 05/13/2021   PLT 266 05/13/2021   BMET    Component Value Date/Time   NA 138 05/13/2021 2311   K 3.6 05/13/2021 2311   CL 107 05/13/2021 2311   CO2 26 05/13/2021 2311   GLUCOSE 113 (H) 05/13/2021 2311   BUN 17 05/13/2021 2311   CREATININE 0.95 05/13/2021 2311   CALCIUM 9.3 05/13/2021 2311   GFRNONAA >60 05/13/2021 2311   Estimated Creatinine Clearance: 120.6 mL/min (by C-G formula based on SCr of 0.95 mg/dL).  COAG Lab Results  Component Value Date   INR 1.0 05/13/2021   Radiology CT Angio Abd/Pel W and/or Wo Contrast  Result Date: 05/14/2021 CLINICAL DATA:  GI bleeding, bilateral lower abdominal pain EXAM: CTA ABDOMEN AND PELVIS WITHOUT AND WITH CONTRAST TECHNIQUE: Multidetector CT imaging of the abdomen and pelvis was performed using the standard protocol during bolus administration of intravenous contrast. Multiplanar reconstructed images and MIPs were obtained and reviewed to evaluate the vascular anatomy. CONTRAST:  129m OMNIPAQUE IOHEXOL 350 MG/ML SOLN COMPARISON:  None. FINDINGS: VASCULAR Aorta: Normal caliber aorta without aneurysm, dissection, vasculitis or significant stenosis. Celiac: Patent without evidence of aneurysm, dissection, vasculitis or significant stenosis. SMA: Patent. No evidence of aneurysm, dissection or vasculitis. No flow-limiting stenosis or occlusion. Renals: Single renal arteries bilaterally. Both renal arteries are patent without evidence of aneurysm, dissection, vasculitis, fibromuscular dysplasia or significant stenosis. IMA: Vessel normally opacified. No acute luminal abnormality. No significant stenosis or occlusion. No aneurysm or ectasia or features of vasculitis. Inflow: Patent without evidence of aneurysm, dissection, vasculitis or significant stenosis. Proximal Outflow: Bilateral common  femoral and visualized portions of the superficial and profunda femoral arteries are patent without evidence of aneurysm, dissection, vasculitis or significant stenosis. Veins: No obvious venous abnormality within the limitations of this arterial phase study. Review of the MIP images confirms the above findings. NON-VASCULAR Lower chest: Atelectatic changes in the otherwise clear lung bases. Hepatobiliary: No worrisome focal liver lesions. Smooth liver surface contour. Normal hepatic attenuation. Normal gallbladder and biliary tree. Pancreas: No pancreatic ductal dilatation or surrounding inflammatory changes. Spleen: Normal in size. No concerning splenic lesions. Adrenals/Urinary Tract: Normal adrenal glands. Kidneys are normally located with symmetric enhancement. No suspicious renal lesion, urolithiasis or hydronephrosis. Postsurgical changes and indentation of the right anterolateral bladder, likely related to a right inguinal hernia repair. Stomach/Bowel: Distal esophagus, stomach and duodenal sweep are unremarkable. No small bowel wall thickening or dilatation. Few small distal ileal noninflamed diverticula. Small focus of arterial contrast enhancement associated with the right-sided cecal diverticulum (July 06, 2061 which appears stable on the more delayed venous phase imaging, could reflect enhancement or minimal extravasation versus artifact due to adjacent bowel gas. Additional numerous colonic diverticula throughout the colonic segments without worrisome focal Peri diverticular inflammation colonic thickening or dilatation. Fluid-filled appearance of the colon. Serpentine hyperattenuation towards the level of the anorectal verge on the arterial phase of imaging (1997/07/06 appears less pronounced on the venous phase imaging. Appearance suggestive of hemorrhoidal enhancement with small site of extravasation less favored. Lymphatic: No significant vascular findings are present. No enlarged abdominal or pelvic lymph  nodes. Reproductive: Vasectomy clips noted bilaterally. The prostate and seminal vesicles are unremarkable. Other: Postsurgical changes in the right inguinal region, possible hernia repair. Additional vertical midline incision anteriorly. Focal subcutaneous nodular thickening in the left flank, could reflect contusive change or injectable use. Musculoskeletal: Multilevel degenerative changes are present in the imaged portions of the spine, hips and pelvis. No acute osseous abnormality or suspicious osseous lesion. IMPRESSION: VASCULAR Two sites of questionable contrast blush including a small focus of arterial enhancement at the level of a cecal diverticulum persisting on the venous phase of imaging. Could reflect bleeding at a noninflamed diverticulum versus artifact due to adjacent bowel gas. Second site is is a more serpentine focus of enhancement diffusing on the venous phase imaging at the level of the anorectal verge. Such an appearance is fairly typical for hemorrhoidal enhancement with small bleed less favored though could correlate with rectal exam. No other acute or conspicuous vascular findings. NON-VASCULAR Diffusely fluid-filled appearance of the colon, correlate for rapid transit state/diarrheal illness. Postsurgical changes in the right inguinal  region with scarring about the right anterolateral bladder, correlate with surgical history for possible prior hernia repair. Prior vasectomy. Electronically Signed   By: Lovena Le M.D.   On: 05/14/2021 00:36    Assessment/Plan The patient is a 50 year old male with a past medical hx of hemorrhoids who presented to the ED after experiencing a "bloody" bowel movement   1. Possible Lower GI Bleed vs Hemorrhoids: No abdominal pain.  Hbg this AM (13.4) from (13.8) last night.  Creatinine normal. Vitals stable, no hypotension. GI plans on scoping the patient today. Pending their findings. At this point, there is not indication for embolization unless  active bleeding is noted / the patient becomes unstable / target is found.  Vascular to continue to follow along.  2. Obesity: Complicated any procedure Encouraged weight loss  Discussed with Dr. Mayme Genta, PA-C  05/14/2021 12:10 PM  This note was created with Dragon medical transcription system.  Any error is purely unintentional.

## 2021-05-14 NOTE — Anesthesia Preprocedure Evaluation (Signed)
Anesthesia Evaluation  Patient identified by MRN, date of birth, ID band Patient awake    History of Anesthesia Complications Negative for: history of anesthetic complications  Airway Mallampati: II  TM Distance: >3 FB Neck ROM: Full    Dental no notable dental hx.    Pulmonary neg pulmonary ROS,    Pulmonary exam normal - rhonchi (-) wheezing      Cardiovascular negative cardio ROS Normal cardiovascular exam Rhythm:Regular Rate:Normal - Systolic murmurs and - Diastolic murmurs    Neuro/Psych    GI/Hepatic Neg liver ROS, ED on 7/28 with rectal bleeding - new onset GIB  05/14/21 CT Abd/Pelvis: IMPRESSION: VASCULAR  Two sites of questionable contrast blush including a small focus of arterial enhancement at the level of a cecal diverticulum persisting on the venous phase of imaging. Could reflect bleeding at a noninflamed diverticulum versus artifact due to adjacent bowel gas.  Second site is is a more serpentine focus of enhancement diffusing on the venous phase imaging at the level of the anorectal verge. Such an appearance is fairly typical for hemorrhoidal enhancement with small bleed less favored though could correlate with rectal exam.  No other acute or conspicuous vascular findings.  NON-VASCULAR  Diffusely fluid-filled appearance of the colon, correlate for rapid transit state/diarrheal illness.  Postsurgical changes in the right inguinal region with scarring about the right anterolateral bladder, correlate with surgical history for possible prior hernia repair.  Prior vasectomy.   Endo/Other  negative endocrine ROS  Renal/GU negative Renal ROS  negative genitourinary   Musculoskeletal negative musculoskeletal ROS (+)   Abdominal Normal abdominal exam  (+)   Peds negative pediatric ROS (+)  Hematology negative hematology ROS (+)   Anesthesia Other Findings   Reproductive/Obstetrics                              Anesthesia Physical Anesthesia Plan  ASA: 2 and emergent  Anesthesia Plan: MAC   Post-op Pain Management:    Induction:   PONV Risk Score and Plan: 0 and Propofol infusion and TIVA  Airway Management Planned: Mask and Natural Airway  Additional Equipment:   Intra-op Plan:   Post-operative Plan:   Informed Consent: I have reviewed the patients History and Physical, chart, labs and discussed the procedure including the risks, benefits and alternatives for the proposed anesthesia with the patient or authorized representative who has indicated his/her understanding and acceptance.     Dental advisory given  Plan Discussed with: CRNA and Anesthesiologist  Anesthesia Plan Comments: (IVGA, PIV x 1, ASA SM, RBO discussed, Consent signed.)        Anesthesia Quick Evaluation

## 2021-05-14 NOTE — Consult Note (Signed)
Brian Wilson , MD [address], [place], [place], [place], [address] [address], [place], [place], [place], [address] Phone: [phone]  Fax: [phone]  Consultation  Referring Provider:     Dr Damita Dunnings Primary Care Physician:  Sallee Provencal, MD Primary Gastroenterologist:  None          Reason for Consultation:     GI bleed  Date of Admission:  05/13/2021 Date of Consultation:  05/14/2021         HPI:   Brian Wilson is a 50 y.o. male presented to the emergency room with rectal bleeding.  History of hemorrhoids.  EMS reported blood pressures with systolics in the 0000000.  Hemoglobin was 13.8 on admission.  He had a CT abdomen and pelvis with contrast on the concern for a blush at the level of a fecal diverticulum and a second site at the anorectal verge.  The patient was discussed with Dr. Marin Shutter and he felt it was more hemorrhoidal in nature.  Patient was commenced on bowel prep with a plan to do a colonoscopy this morning.   Hb this morning at 5 am was stable at 13.4 grams.No prior colonoscopy .   Says had atleast 10 episodes of bright red blood per rectum since last night , every time he went to have a bowel movement was bloody. No bleeding noted after the bowel prep . No prior colonoscopy , no famioly history of colon cancer or polyps. No weight loss. Feels well presently.  History reviewed. No pertinent past medical history.  History reviewed. No pertinent surgical history.  Prior to Admission medications   Medication Sig Start Date End Date Taking? Authorizing Provider  cholecalciferol (VITAMIN D) 25 MCG (1000 UNIT) tablet Take 1,000 Units by mouth daily.   Yes [provider]  Multiple Vitamin (MULTIVITAMIN WITH MINERALS) TABS tablet Take 1 tablet by mouth daily.   Yes [provider]  ZINC OXIDE PO Take by mouth.   Yes [provider]    History reviewed. No pertinent family history.   Social History   Tobacco Use  . Smoking status:  Never  . Smokeless tobacco: Never  Substance Use Topics  . Alcohol use: Yes    Alcohol/week: 2.0 standard drinks    Types: 2 Glasses of wine per week    Comment: OCCAISIONALLY    Allergies as of 05/13/2021  . (Not on File)    Review of Systems:    All systems reviewed and negative except where noted in HPI.   Physical Exam:  Vital signs in last 24 hours: Temp:  [98.2 F (36.8 C)] 98.2 F (36.8 C) (07/29 0000) Pulse Rate:  [67-90] 67 (07/29 0600) Resp:  [14-18] 14 (07/29 0600) BP: (112-139)/(68-90) 126/75 (07/29 0600) SpO2:  [93 %-99 %] 99 % (07/29 0600) Weight:  [104.3 kg] 104.3 kg (07/28 2308)   General:   Pleasant, cooperative in NAD Head:  Normocephalic and atraumatic. Eyes:   No icterus.   Conjunctiva pink. PERRLA. Ears:  Normal auditory acuity. Neck:  Supple; no masses or thyroidomegaly Lungs: Respirations even and unlabored. Lungs clear to auscultation bilaterally.   No wheezes, crackles, or rhonchi.  Heart:  Regular rate and rhythm;  Without murmur, clicks, rubs or gallops Abdomen:  Soft, nondistended, nontender. Normal bowel sounds. No appreciable masses or hepatomegaly.  No rebound or guarding.  Neurologic:  Alert and oriented x3;  grossly normal neurologically. Skin:  Intact without significant lesions or rashes. Cervical Nodes:  No  significant cervical adenopathy. Psych:  Alert and cooperative. Normal affect.  LAB RESULTS: Recent Labs    05/13/21 2311 05/14/21 0508  WBC 9.0  --   HGB 13.8 13.4  HCT 40.5 38.9*  PLT 266  --    BMET Recent Labs    05/13/21 2311  NA 138  K 3.6  CL 107  CO2 26  GLUCOSE 113*  BUN 17  CREATININE 0.95  CALCIUM 9.3   LFT Recent Labs    05/13/21 2311  PROT 6.2*  ALBUMIN 3.9  AST 18  ALT 17  ALKPHOS 44  BILITOT 0.6   PT/INR Recent Labs    05/13/21 2311  LABPROT 13.1  INR 1.0    STUDIES: CT Angio Abd/Pel W and/or Wo Contrast  Result Date: 05/14/2021 CLINICAL DATA:  GI bleeding, bilateral lower  abdominal pain EXAM: CTA ABDOMEN AND PELVIS WITHOUT AND WITH CONTRAST TECHNIQUE: Multidetector CT imaging of the abdomen and pelvis was performed using the standard protocol during bolus administration of intravenous contrast. Multiplanar reconstructed images and MIPs were obtained and reviewed to evaluate the vascular anatomy. CONTRAST:  168m OMNIPAQUE IOHEXOL 350 MG/ML SOLN COMPARISON:  None. FINDINGS: VASCULAR Aorta: Normal caliber aorta without aneurysm, dissection, vasculitis or significant stenosis. Celiac: Patent without evidence of aneurysm, dissection, vasculitis or significant stenosis. SMA: Patent. No evidence of aneurysm, dissection or vasculitis. No flow-limiting stenosis or occlusion. Renals: Single renal arteries bilaterally. Both renal arteries are patent without evidence of aneurysm, dissection, vasculitis, fibromuscular dysplasia or significant stenosis. IMA: Vessel normally opacified. No acute luminal abnormality. No significant stenosis or occlusion. No aneurysm or ectasia or features of vasculitis. Inflow: Patent without evidence of aneurysm, dissection, vasculitis or significant stenosis. Proximal Outflow: Bilateral common femoral and visualized portions of the superficial and profunda femoral arteries are patent without evidence of aneurysm, dissection, vasculitis or significant stenosis. Veins: No obvious venous abnormality within the limitations of this arterial phase study. Review of the MIP images confirms the above findings. NON-VASCULAR Lower chest: Atelectatic changes in the otherwise clear lung bases. Hepatobiliary: No worrisome focal liver lesions. Smooth liver surface contour. Normal hepatic attenuation. Normal gallbladder and biliary tree. Pancreas: No pancreatic ductal dilatation or surrounding inflammatory changes. Spleen: Normal in size. No concerning splenic lesions. Adrenals/Urinary Tract: Normal adrenal glands. Kidneys are normally located with symmetric enhancement. No  suspicious renal lesion, urolithiasis or hydronephrosis. Postsurgical changes and indentation of the right anterolateral bladder, likely related to a right inguinal hernia repair. Stomach/Bowel: Distal esophagus, stomach and duodenal sweep are unremarkable. No small bowel wall thickening or dilatation. Few small distal ileal noninflamed diverticula. Small focus of arterial contrast enhancement associated with the right-sided cecal diverticulum (10-Jul-2061 which appears stable on the more delayed venous phase imaging, could reflect enhancement or minimal extravasation versus artifact due to adjacent bowel gas. Additional numerous colonic diverticula throughout the colonic segments without worrisome focal Peri diverticular inflammation colonic thickening or dilatation. Fluid-filled appearance of the colon. Serpentine hyperattenuation towards the level of the anorectal verge on the arterial phase of imaging (1997-07-10 appears less pronounced on the venous phase imaging. Appearance suggestive of hemorrhoidal enhancement with small site of extravasation less favored. Lymphatic: No significant vascular findings are present. No enlarged abdominal or pelvic lymph nodes. Reproductive: Vasectomy clips noted bilaterally. The prostate and seminal vesicles are unremarkable. Other: Postsurgical changes in the right inguinal region, possible hernia repair. Additional vertical midline incision anteriorly. Focal subcutaneous nodular thickening in the left flank, could reflect contusive change or injectable use. Musculoskeletal: Multilevel degenerative changes  are present in the imaged portions of the spine, hips and pelvis. No acute osseous abnormality or suspicious osseous lesion. IMPRESSION: VASCULAR Two sites of questionable contrast blush including a small focus of arterial enhancement at the level of a cecal diverticulum persisting on the venous phase of imaging. Could reflect bleeding at a noninflamed diverticulum versus artifact due  to adjacent bowel gas. Second site is is a more serpentine focus of enhancement diffusing on the venous phase imaging at the level of the anorectal verge. Such an appearance is fairly typical for hemorrhoidal enhancement with small bleed less favored though could correlate with rectal exam. No other acute or conspicuous vascular findings. NON-VASCULAR Diffusely fluid-filled appearance of the colon, correlate for rapid transit state/diarrheal illness. Postsurgical changes in the right inguinal region with scarring about the right anterolateral bladder, correlate with surgical history for possible prior hernia repair. Prior vasectomy. Electronically Signed   By: Lovena Le M.D.   On: 05/14/2021 00:36      Impression / Plan:   Brian Wilson is a 50 y.o. y/o male presented to the ER with rectal bleeding.  Hemoglobin over 13 g on admission.  CT angiogram showed possible bleed at the level of the diverticulum versus anal verge.  Discussed with Dr. Rob Hickman vascular surgery felt likely due to hemorrhoids.  Commenced bowel prep in the night after discussing with the on-call physician for GI Dr. Bonna Gains.  Plan 1.  Monitor CBC and transfuse 2.  Colonoscopy today   I have discussed alternative options, risks & benefits,  which include, but are not limited to, bleeding, infection, perforation,respiratory complication & drug reaction.  The patient agrees with this plan & written consent will be obtained.     Thank you for involving me in the care of this patient.      LOS: 0 days   Brian Bellows, MD  05/14/2021, 8:27 AM

## 2021-05-14 NOTE — H&P (Signed)
     Jonathon Bellows, MD [address], [place], [place], [place], [address] [address], [place], [place], [place], [address] Phone: [phone]  Fax: [phone]  Primary Care Physician:  Sallee Provencal, MD   Pre-Procedure History & Physical: HPI:  Brian Wilson is a 50 y.o. male is here for an colonoscopy.   History reviewed. No pertinent past medical history.  Past Surgical History:  Procedure Laterality Date   APPENDECTOMY     HERNIA REPAIR      Prior to Admission medications   Medication Sig Start Date End Date Taking? Authorizing Provider  cholecalciferol (VITAMIN D) 25 MCG (1000 UNIT) tablet Take 1,000 Units by mouth daily.   Yes [provider]  Multiple Vitamin (MULTIVITAMIN WITH MINERALS) TABS tablet Take 1 tablet by mouth daily.   Yes [provider]  ZINC OXIDE PO Take by mouth.   Yes [provider]    Allergies as of 05/13/2021   (Not on File)    History reviewed. No pertinent family history.  Social History   Socioeconomic History   Marital status: Married    Spouse name: Not on file   Number of children: Not on file   Years of education: Not on file   Highest education level: Not on file  Occupational History   Not on file  Tobacco Use   Smoking status: Never   Smokeless tobacco: Never  Substance and Sexual Activity   Alcohol use: Yes    Alcohol/week: 2.0 standard drinks    Types: 2 Glasses of wine per week    Comment: OCCAISIONALLY   Drug use: Not on file   Sexual activity: Yes  Other Topics Concern   Not on file  Social History Narrative   Not on file   Social Determinants of Health   Financial Resource Strain: Not on file  Food Insecurity: Not on file  Transportation Needs: Not on file  Physical Activity: Not on file  Stress: Not on file  Social Connections: Not on file  Intimate Partner Violence: Not on file    Review of Systems: See HPI, otherwise negative ROS  Physical Exam: BP (!)  137/92   Pulse 75   Temp (!) 96.7 F (35.9 C) (Temporal)   Resp 18   Ht '6\' 1"'$  (1.854 m)   Wt 104.3 kg   SpO2 100%   BMI 30.34 kg/m  General:   Alert,  pleasant and cooperative in NAD Head:  Normocephalic and atraumatic. Neck:  Supple; no masses or thyromegaly. Lungs:  Clear throughout to auscultation, normal respiratory effort.    Heart:  +S1, +S2, Regular rate and rhythm, No edema. Abdomen:  Soft, nontender and nondistended. Normal bowel sounds, without guarding, and without rebound.   Neurologic:  Alert and  oriented x4;  grossly normal neurologically.  Impression/Plan: Brian Wilson is here for an colonoscopy to be performed for lower gi bleed Risks, benefits, limitations, and alternatives regarding  colonoscopy have been reviewed with the patient.  Questions have been answered.  All parties agreeable.   Jonathon Bellows, MD  05/14/2021, 12:38 PM

## 2021-05-14 NOTE — ED Notes (Signed)
Patient encouraged to complete bowel prep. Patient verbalized understanding.

## 2021-05-14 NOTE — ED Notes (Signed)
Patient complaining of N/V. Medications administered as ordered.

## 2021-05-14 NOTE — Progress Notes (Signed)
No charge progress note.    Brian Wilson is a 50 y.o. male with a history of hemorrhoids who presents to the ED with onset of GI bleeding on the night of arrival.  Patient had 2 large maroon-colored bowel movements and became lightheaded and diaphoretic.  He denied shortness of breath palpitations or associated chest pain.  Denies abdominal pain.  Patient had softer blood pressure when EMS arrived which responded well to fluid resuscitation.  Hemodynamically stable, labs with hemoglobin of 13.8 and otherwise unremarkable, CT abdomen and pelvis with and without contrast with 2 questionable areas of contrast blush so vascular surgery was also consulted but they does not think that he needs any embolization.  Patient underwent colonoscopy with GI, multiple polyps were removed and sent for pathology.  There was no active bleeding and it was thought to be either a diverticular bleed or bleeding with internal hemorrhoids.  GI is recommending outpatient follow-up for internal hemorrhoidal banding.  Patient currently stable with benign exam.  We will keep him for another night in [place] area. Continue current management. Monitor hemoglobin Transfuse if below 7

## 2021-05-14 NOTE — Op Note (Signed)
[facility] Gastroenterology Patient Name: Brian Wilson Procedure Date: 05/14/2021 12:40 PM MRN: [id number] Account #: [id number] Date of Birth: 1971-01-01 Admit Type: Outpatient Age: 50 Room: [facility] ENDO ROOM 4 Gender: Male Note Status: Finalized Procedure:             Colonoscopy Indications:           Hematochezia Providers:             Jonathon Bellows MD, MD Referring MD:          No Local Md, MD (Referring MD) Medicines:             Monitored Anesthesia Care Complications:         No immediate complications. Procedure:             Pre-Anesthesia Assessment:                        - Prior to the procedure, a History and Physical was                         performed, and patient medications, allergies and                         sensitivities were reviewed. The patient's tolerance                         of previous anesthesia was reviewed.                        - The risks and benefits of the procedure and the                         sedation options and risks were discussed with the                         patient. All questions were answered and informed                         consent was obtained.                        - ASA Grade Assessment: II - A patient with mild                         systemic disease.                        After obtaining informed consent, the colonoscope was                         passed under direct vision. Throughout the procedure,                         the patient's blood pressure, pulse, and oxygen                         saturations were monitored continuously. The                         Colonoscope was introduced through the anus and  advanced to the the cecum, identified by the                         appendiceal orifice. The colonoscopy was performed                         with ease. The patient tolerated the procedure well.                         The quality of the bowel preparation was  excellent. Findings:      The perianal and digital rectal examinations were normal.      Two sessile polyps were found in the sigmoid colon and transverse colon.       The polyps were 4 to 6 mm in size. These polyps were removed with a cold       snare. Resection and retrieval were complete.      Multiple small and large-mouthed diverticula were found in the rectum,       left colon and right colon.      Non-bleeding internal hemorrhoids were found during retroflexion. The       hemorrhoids were severe, large and Grade I (internal hemorrhoids that do       not prolapse).      A 15 mm polyp was found in the distal rectum. The polyp was sessile.       Preparations were made for mucosal resection. Saline was injected to       raise the lesion. Snare mucosal resection was performed. Resection and       retrieval were complete. To prevent bleeding after the polypectomy, one       hemostatic clip was successfully placed. There was no bleeding at the       end of the procedure. Margins of polyp marked using blue/NBI light      The exam was otherwise without abnormality on direct and retroflexion       views. Impression:            - Two 4 to 6 mm polyps in the sigmoid colon and in the                         transverse colon, removed with a cold snare. Resected                         and retrieved.                        - Diverticulosis in the rectum, in the left colon and                         in the right colon.                        - Non-bleeding internal hemorrhoids.                        - One 15 mm polyp in the distal rectum, removed with                         mucosal resection. Resected and retrieved. Clip was  placed.                        - The examination was otherwise normal on direct and                         retroflexion views.                        - Mucosal resection was performed. Resection and                         retrieval were  complete. Recommendation:        - Return patient to hospital ward for ongoing care.                        - Advance diet as tolerated.                        - Continue present medications.                        - Likely lower GI bleed diverticular +/- hemorroidal                        Suggest outpatient banding of hemorroids at my office                        If has further bleed get tagged rbc scan                        F/u bx results Procedure Code(s):     --- Professional ---                        [phone], Colonoscopy, flexible; with endoscopic mucosal                         resection                        45385, 59, Colonoscopy, flexible; with removal of                         tumor(s), polyp(s), or other lesion(s) by snare                         technique Diagnosis Code(s):     --- Professional ---                        K63.5, Polyp of colon                        K64.0, First degree hemorrhoids                        K62.1, Rectal polyp                        K92.1, Melena (includes Hematochezia)                        K57.30, Diverticulosis of large  intestine without                         perforation or abscess without bleeding CPT copyright 2019 [employer]. All rights reserved. The codes documented in this report are preliminary and upon coder review may  be revised to meet current compliance requirements. Jonathon Bellows, MD Jonathon Bellows MD, MD 05/14/2021 1:18:12 PM This report has been signed electronically. Number of Addenda: 0 Note Initiated On: 05/14/2021 12:40 PM Scope Withdrawal Time: 0 hours 18 minutes 45 seconds  Total Procedure Duration: 0 hours 21 minutes 32 seconds  Estimated Blood Loss:  Estimated blood loss: none.      [facility]

## 2021-05-15 ENCOUNTER — Inpatient Hospital Stay: Payer: BC Managed Care – PPO

## 2021-05-15 ENCOUNTER — Encounter: Payer: Self-pay | Admitting: Internal Medicine

## 2021-05-15 LAB — CBC
HCT: 37.2 % — ABNORMAL LOW (ref 39.0–52.0)
HCT: 40.7 % (ref 39.0–52.0)
HCT: 36.5 % — ABNORMAL LOW (ref 39.0–52.0)
Hemoglobin: 12.3 g/dL — ABNORMAL LOW (ref 13.0–17.0)
Hemoglobin: 13.9 g/dL (ref 13.0–17.0)
Hemoglobin: 12.5 g/dL — ABNORMAL LOW (ref 13.0–17.0)
MCH: 30.8 pg (ref 26.0–34.0)
MCH: 32.6 pg (ref 26.0–34.0)
MCH: 32.1 pg (ref 26.0–34.0)
MCHC: 33.1 g/dL (ref 30.0–36.0)
MCHC: 34.2 g/dL (ref 30.0–36.0)
MCHC: 34.2 g/dL (ref 30.0–36.0)
MCV: 93 fL (ref 80.0–100.0)
MCV: 95.5 fL (ref 80.0–100.0)
MCV: 93.8 fL (ref 80.0–100.0)
Platelets: 244 10*3/uL (ref 150–400)
Platelets: 261 10*3/uL (ref 150–400)
Platelets: 269 10*3/uL (ref 150–400)
RBC: 4 MIL/uL — ABNORMAL LOW (ref 4.22–5.81)
RBC: 4.26 MIL/uL (ref 4.22–5.81)
RBC: 3.89 MIL/uL — ABNORMAL LOW (ref 4.22–5.81)
RDW: 13 % (ref 11.5–15.5)
RDW: 13 % (ref 11.5–15.5)
RDW: 12.9 % (ref 11.5–15.5)
WBC: 5.6 10*3/uL (ref 4.0–10.5)
WBC: 5.8 10*3/uL (ref 4.0–10.5)
WBC: 6.3 10*3/uL (ref 4.0–10.5)
nRBC: 0 % (ref 0.0–0.2)
nRBC: 0 % (ref 0.0–0.2)
nRBC: 0 % (ref 0.0–0.2)

## 2021-05-15 LAB — BASIC METABOLIC PANEL
Anion gap: 3 — ABNORMAL LOW (ref 5–15)
BUN: 10 mg/dL (ref 6–20)
CO2: 26 mmol/L (ref 22–32)
Calcium: 8.3 mg/dL — ABNORMAL LOW (ref 8.9–10.3)
Chloride: 109 mmol/L (ref 98–111)
Creatinine, Ser: 0.87 mg/dL (ref 0.61–1.24)
GFR, Estimated: >60 mL/min (ref >60)
Glucose, Bld: 103 mg/dL — ABNORMAL HIGH (ref 70–99)
Potassium: 3.6 mmol/L (ref 3.5–5.1)
Sodium: 138 mmol/L (ref 135–145)

## 2021-05-15 MED ORDER — LACTATED RINGERS IV SOLN: INTRAVENOUS | Status: DC

## 2021-05-15 MED ORDER — TECHNETIUM TC 99M-LABELED RED BLOOD CELLS IV KIT
20.0000 | PACK | Freq: Once | INTRAVENOUS | Status: AC | PRN
  Administered 2021-05-15: 21.72 via INTRAVENOUS

## 2021-05-15 NOTE — Progress Notes (Signed)
PROGRESS NOTE    Brian Wilson  [id number] DOB: 1971/04/25 DOA: 05/13/2021 PCP: Sallee Provencal, MD   Brief Narrative: Taken from prior notes. Brian Wilson is a 50 y.o. male with a history of hemorrhoids who presents to the ED with onset of GI bleeding on the night of arrival.  Patient had 2 large maroon-colored bowel movements and became lightheaded and diaphoretic.  He denied shortness of breath palpitations or associated chest pain.  Denies abdominal pain.  Patient had softer blood pressure when EMS arrived which responded well to fluid resuscitation.   Hemodynamically stable, labs with hemoglobin of 13.8 and otherwise unremarkable, CT abdomen and pelvis with and without contrast with 2 questionable areas of contrast blush so vascular surgery was also consulted but they does not think that he needs any embolization.   Patient underwent colonoscopy with GI, multiple polyps were removed and sent for pathology.  There was no active bleeding and it was thought to be either a diverticular bleed or bleeding with internal hemorrhoids.  GI is recommending outpatient follow-up for internal hemorrhoidal banding.  Patient again developed bloody bowel movements since this morning.  GI recommended if a general surgery can help as there is concern of hemorrhoidal bleed. Tagged RBC scan was also ordered.  Hemoglobin seems stable.  Subjective: Patient had 3 bloody bowel movements since this morning.  Denies any abdominal pain.  Assessment & Plan:   Active Problems:   Rectal bleed   Lower GI bleed   Hypotension  Lower GI bleed/internal hemorrhoidal versus diverticular.  Patient underwent colonoscopy with removal of few polyps which were sent for pathology.  There were nonbleeding internal hemorrhoids.  Concern of hemorrhoidal versus diverticular bleed.  Hemoglobin remained stable but patient was complaining of 3 bloody bowel movements since this morning.  General surgery was also consulted at  request of GI and they would like to wait for tagged RBC scan results. -Follow-up nuclear medicine tagged RBC scan. -Monitor hemoglobin. -Keep patient n.p.o. until scan and if normal he can start on clear liquid. -Continue with Protonix  Objective: Vitals:   05/14/21 1505 05/14/21 2204 05/15/21 0423 05/15/21 0744  BP: 137/87 (!) 142/89 129/71 131/87  Pulse: 72 77 75 82  Resp: '18 20 16   '$ Temp: 98.3 F (36.8 C) 97.9 F (36.6 C) 97.9 F (36.6 C) 98.1 F (36.7 C)  TempSrc: Oral Oral Oral Oral  SpO2: 100% 98% 97% 97%  Weight:      Height:        Intake/Output Summary (Last 24 hours) at 05/15/2021 1448 Last data filed at 05/15/2021 1300 Gross per 24 hour  Intake 1301.04 ml  Output --  Net 1301.04 ml   Filed Weights   05/13/21 2308 05/14/21 1154  Weight: 104.3 kg 104.3 kg    Examination:  General exam: Appears calm and comfortable  Respiratory system: Clear to auscultation. Respiratory effort normal. Cardiovascular system: S1 & S2 heard, RRR. No JVD, murmurs, rubs, gallops or clicks. Gastrointestinal system: Soft, nontender, nondistended, bowel sounds positive. Central nervous system: Alert and oriented. No focal neurological deficits.Symmetric 5 x 5 power. Extremities: No edema, no cyanosis, pulses intact and symmetrical. Psychiatry: Judgement and insight appear normal. Mood & affect appropriate.    DVT prophylaxis: SCDs Code Status: Full Family Communication: Wife was updated at bedside Disposition Plan:  Status is: Inpatient  Remains inpatient appropriate because:Inpatient level of care appropriate due to severity of illness  Dispo: The patient is from: Home  Anticipated d/c is to: Home              Patient currently is not medically stable to d/c.   Difficult to place patient No              Level of care: Med-Surg  All the records are reviewed and case discussed with Care Management/Social Worker. Management plans discussed with the patient,  nursing and they are in agreement.  Consultants:  Gastroenterology Vascular surgery General surgery  Procedures:  Antimicrobials:   Data Reviewed: I have personally reviewed following labs and imaging studies  CBC: Recent Labs  Lab 05/13/21 2311 05/14/21 0508 05/14/21 1510 05/15/21 0415 05/15/21 1135  WBC 9.0  --   --  5.6 5.8  HGB 13.8 13.4 13.0 12.3* 13.9  HCT 40.5 38.9* 38.0* 37.2* 40.7  MCV 92.5  --   --  93.0 95.5  PLT 266  --   --  244 0000000   Basic Metabolic Panel: Recent Labs  Lab 05/13/21 2311 05/15/21 0415  NA 138 138  K 3.6 3.6  CL 107 109  CO2 26 26  GLUCOSE 113* 103*  BUN 17 10  CREATININE 0.95 0.87  CALCIUM 9.3 8.3*   GFR: Estimated Creatinine Clearance: 130.3 mL/min (by C-G formula based on SCr of 0.87 mg/dL). Liver Function Tests: Recent Labs  Lab 05/13/21 2311  AST 18  ALT 17  ALKPHOS 44  BILITOT 0.6  PROT 6.2*  ALBUMIN 3.9   No results for input(s): LIPASE, AMYLASE in the last 168 hours. No results for input(s): AMMONIA in the last 168 hours. Coagulation Profile: Recent Labs  Lab 05/13/21 2311  INR 1.0   Cardiac Enzymes: No results for input(s): CKTOTAL, CKMB, CKMBINDEX, TROPONINI in the last 168 hours. BNP (last 3 results) No results for input(s): PROBNP in the last 8760 hours. HbA1C: No results for input(s): HGBA1C in the last 72 hours. CBG: No results for input(s): GLUCAP in the last 168 hours. Lipid Profile: No results for input(s): CHOL, HDL, LDLCALC, TRIG, CHOLHDL, LDLDIRECT in the last 72 hours. Thyroid Function Tests: No results for input(s): TSH, T4TOTAL, FREET4, T3FREE, THYROIDAB in the last 72 hours. Anemia Panel: No results for input(s): VITAMINB12, FOLATE, FERRITIN, TIBC, IRON, RETICCTPCT in the last 72 hours. Sepsis Labs: No results for input(s): PROCALCITON, LATICACIDVEN in the last 168 hours.  Recent Results (from the past 240 hour(s))  Resp Panel by RT-PCR (Flu A&B, Covid) Nasopharyngeal Swab     Status:  None   Collection Time: 05/14/21  1:09 AM   Specimen: Nasopharyngeal Swab; Nasopharyngeal(NP) swabs in vial transport medium  Result Value Ref Range Status   SARS Coronavirus 2 by RT PCR NEGATIVE NEGATIVE Final    Comment: (NOTE) SARS-CoV-2 target nucleic acids are NOT DETECTED.  The SARS-CoV-2 RNA is generally detectable in upper respiratory specimens during the acute phase of infection. The lowest concentration of SARS-CoV-2 viral copies this assay can detect is 138 copies/mL. A negative result does not preclude SARS-Cov-2 infection and should not be used as the sole basis for treatment or other patient management decisions. A negative result may occur with  improper specimen collection/handling, submission of specimen other than nasopharyngeal swab, presence of viral mutation(s) within the areas targeted by this assay, and inadequate number of viral copies(<138 copies/mL). A negative result must be combined with clinical observations, patient history, and epidemiological information. The expected result is Negative.  Fact Sheet for Patients:  EntrepreneurPulse.com.au  Fact Sheet for Healthcare Providers:  IncredibleEmployment.be  This test is no t yet approved or cleared by the [place] and  has been authorized for detection and/or diagnosis of SARS-CoV-2 by FDA under an Emergency Use Authorization (EUA). This EUA will remain  in effect (meaning this test can be used) for the duration of the COVID-19 declaration under Section 564(b)(1) of the Act, 21 U.S.C.section 360bbb-3(b)(1), unless the authorization is terminated  or revoked sooner.       Influenza A by PCR NEGATIVE NEGATIVE Final   Influenza B by PCR NEGATIVE NEGATIVE Final    Comment: (NOTE) The Xpert Xpress SARS-CoV-2/FLU/RSV plus assay is intended as an aid in the diagnosis of influenza from Nasopharyngeal swab specimens and should not be used as a sole basis for  treatment. Nasal washings and aspirates are unacceptable for Xpert Xpress SARS-CoV-2/FLU/RSV testing.  Fact Sheet for Patients: EntrepreneurPulse.com.au  Fact Sheet for Healthcare Providers: IncredibleEmployment.be  This test is not yet approved or cleared by the [place] FDA and has been authorized for detection and/or diagnosis of SARS-CoV-2 by FDA under an Emergency Use Authorization (EUA). This EUA will remain in effect (meaning this test can be used) for the duration of the COVID-19 declaration under Section 564(b)(1) of the Act, 21 U.S.C. section 360bbb-3(b)(1), unless the authorization is terminated or revoked.  Performed at [facility], [address], [place], [place] [address]      Radiology Studies: CT Angio Abd/Pel W and/or Wo Contrast  Result Date: 05/14/2021 CLINICAL DATA:  GI bleeding, bilateral lower abdominal pain EXAM: CTA ABDOMEN AND PELVIS WITHOUT AND WITH CONTRAST TECHNIQUE: Multidetector CT imaging of the abdomen and pelvis was performed using the standard protocol during bolus administration of intravenous contrast. Multiplanar reconstructed images and MIPs were obtained and reviewed to evaluate the vascular anatomy. CONTRAST:  172m OMNIPAQUE IOHEXOL 350 MG/ML SOLN COMPARISON:  None. FINDINGS: VASCULAR Aorta: Normal caliber aorta without aneurysm, dissection, vasculitis or significant stenosis. Celiac: Patent without evidence of aneurysm, dissection, vasculitis or significant stenosis. SMA: Patent. No evidence of aneurysm, dissection or vasculitis. No flow-limiting stenosis or occlusion. Renals: Single renal arteries bilaterally. Both renal arteries are patent without evidence of aneurysm, dissection, vasculitis, fibromuscular dysplasia or significant stenosis. IMA: Vessel normally opacified. No acute luminal abnormality. No significant stenosis or occlusion. No aneurysm or ectasia or features of vasculitis. Inflow:  Patent without evidence of aneurysm, dissection, vasculitis or significant stenosis. Proximal Outflow: Bilateral common femoral and visualized portions of the superficial and profunda femoral arteries are patent without evidence of aneurysm, dissection, vasculitis or significant stenosis. Veins: No obvious venous abnormality within the limitations of this arterial phase study. Review of the MIP images confirms the above findings. NON-VASCULAR Lower chest: Atelectatic changes in the otherwise clear lung bases. Hepatobiliary: No worrisome focal liver lesions. Smooth liver surface contour. Normal hepatic attenuation. Normal gallbladder and biliary tree. Pancreas: No pancreatic ductal dilatation or surrounding inflammatory changes. Spleen: Normal in size. No concerning splenic lesions. Adrenals/Urinary Tract: Normal adrenal glands. Kidneys are normally located with symmetric enhancement. No suspicious renal lesion, urolithiasis or hydronephrosis. Postsurgical changes and indentation of the right anterolateral bladder, likely related to a right inguinal hernia repair. Stomach/Bowel: Distal esophagus, stomach and duodenal sweep are unremarkable. No small bowel wall thickening or dilatation. Few small distal ileal noninflamed diverticula. Small focus of arterial contrast enhancement associated with the right-sided cecal diverticulum (9/62) which appears stable on the more delayed venous phase imaging, could reflect enhancement or minimal extravasation versus artifact due to adjacent bowel gas. Additional  numerous colonic diverticula throughout the colonic segments without worrisome focal Peri diverticular inflammation colonic thickening or dilatation. Fluid-filled appearance of the colon. Serpentine hyperattenuation towards the level of the anorectal verge on the arterial phase of imaging 07-17-97) appears less pronounced on the venous phase imaging. Appearance suggestive of hemorrhoidal enhancement with small site of  extravasation less favored. Lymphatic: No significant vascular findings are present. No enlarged abdominal or pelvic lymph nodes. Reproductive: Vasectomy clips noted bilaterally. The prostate and seminal vesicles are unremarkable. Other: Postsurgical changes in the right inguinal region, possible hernia repair. Additional vertical midline incision anteriorly. Focal subcutaneous nodular thickening in the left flank, could reflect contusive change or injectable use. Musculoskeletal: Multilevel degenerative changes are present in the imaged portions of the spine, hips and pelvis. No acute osseous abnormality or suspicious osseous lesion. IMPRESSION: VASCULAR Two sites of questionable contrast blush including a small focus of arterial enhancement at the level of a cecal diverticulum persisting on the venous phase of imaging. Could reflect bleeding at a noninflamed diverticulum versus artifact due to adjacent bowel gas. Second site is is a more serpentine focus of enhancement diffusing on the venous phase imaging at the level of the anorectal verge. Such an appearance is fairly typical for hemorrhoidal enhancement with small bleed less favored though could correlate with rectal exam. No other acute or conspicuous vascular findings. NON-VASCULAR Diffusely fluid-filled appearance of the colon, correlate for rapid transit state/diarrheal illness. Postsurgical changes in the right inguinal region with scarring about the right anterolateral bladder, correlate with surgical history for possible prior hernia repair. Prior vasectomy. Electronically Signed   By: Lovena Le M.D.   On: 05/14/2021 00:36    Scheduled Meds:  pantoprazole (PROTONIX) IV  40 mg Intravenous Q24H   Continuous Infusions:  sodium chloride 20 mL/hr at 05/14/21 1244   lactated ringers 100 mL/hr at 05/15/21 1429     LOS: 1 day   Time spent: 38 minutes. More than 50% of the time was spent in counseling/coordination of care  Lorella Nimrod,  MD [facility]  If 7PM-7AM, please contact night-coverage [email or web address]  05/15/2021, 2:48 PM   This record has been created using Systems analyst. Errors have been sought and corrected,but may not always be located. Such creation errors do not reflect on the standard of care.

## 2021-05-15 NOTE — Consult Note (Signed)
[facility] SURGICAL CONSULTATION NOTE (initial) - cpt: 99254   HISTORY OF PRESENT ILLNESS (HPI):  50 y.o. male presented to [facility] ED 2 days ago for evaluation of rectal bleeding. Patient reports his bleeding stopped during his bowel prep, and underwent colonoscopy yesterday.  The blood was bright red, and of sufficient volume that he became somewhat diaphoretic and faint, he wisely stopped driving himself and had others drive him to the ED. CTA and colonoscopy did not disclose location of bleeding, leaving differentials available for possible diverticular bleed, or even hemorrhoidal. He had one meal consisting of rice yesterday evening, but still having liquid movements he had 3 bloody bowel movements this morning.  Despite this he has maintained hemodynamic stability.  His initial onset of bleeding seem to be delayed after a somewhat traumatic/painful bowel movement.  He reports he is usually having liquid loose stools.  And has not had any traumatic or painful movement since his admission.  The appearance of the blood was consistent with that of when he initially presented to the hospital. He denies utilizing any blood thinners, and denies any hepatic disease. Surgery is consulted by hospitalist physician Dr. Reesa Chew in this context for evaluation and management  of lower GI bleed, possible hemorrhoidal bleeding.  PAST MEDICAL HISTORY (PMH):  History reviewed. No pertinent past medical history.   PAST SURGICAL HISTORY [facility]):  Past Surgical History:  Procedure Laterality Date   APPENDECTOMY     COLONOSCOPY N/A 05/14/2021   Procedure: COLONOSCOPY;  Surgeon: Jonathon Bellows, MD;  Location: [facility] ENDOSCOPY;  Service: Gastroenterology;  Laterality: N/A;   HERNIA REPAIR       MEDICATIONS:  Prior to Admission medications   Medication Sig Start Date End Date Taking? Authorizing Provider  cholecalciferol (VITAMIN D) 25 MCG (1000 UNIT) tablet Take 1,000 Units by mouth daily.   Yes [provider]  Multiple Vitamin (MULTIVITAMIN WITH MINERALS) TABS tablet Take 1 tablet by mouth daily.   Yes [provider]  ZINC OXIDE PO Take by mouth.   Yes [provider]     ALLERGIES:  No Known Allergies   SOCIAL HISTORY:  Social History   Socioeconomic History   Marital status: Married    Spouse name: Not on file   Number of children: Not on file   Years of education: Not on file   Highest education level: Not on file  Occupational History   Not on file  Tobacco Use   Smoking status: Never   Smokeless tobacco: Never  Substance and Sexual Activity   Alcohol use: Yes    Alcohol/week: 2.0 standard drinks    Types: 2 Glasses of wine per week    Comment: OCCAISIONALLY   Drug use: Not on file   Sexual activity: Yes  Other Topics Concern   Not on file  Social History Narrative   Not on file   Social Determinants of Health   Financial Resource Strain: Not on file  Food Insecurity: Not on file  Transportation Needs: Not on file  Physical Activity: Not on file  Stress: Not on file  Social Connections: Not on file  Intimate Partner Violence: Not on file     FAMILY HISTORY:  History reviewed. No pertinent family history.    REVIEW OF SYSTEMS:  Review of Systems  Constitutional: Negative.   HENT: Negative.    Eyes: Negative.   Respiratory: Negative.    Cardiovascular: Negative.   Gastrointestinal:  Positive for blood in stool and diarrhea. Negative  for constipation, nausea and vomiting.  Genitourinary: Negative.   Skin: Negative.   Neurological: Negative.   Psychiatric/Behavioral: Negative.     VITAL SIGNS:  Temp:  [97.9 F (36.6 C)-98.3 F (36.8 C)] 98.1 F (36.7 C) (07/30 0744) Pulse Rate:  [72-83] 82 (07/30 0744) Resp:  [15-20] 16 (07/30 0423) BP: (117-142)/(71-89) 131/87 (07/30 0744) SpO2:  [97 %-100 %] 97 % (07/30 0744)     Height: '6\' 1"'$  (185.4 cm) Weight: 104.3 kg BMI (Calculated): 30.35   INTAKE/OUTPUT:  07/29 0701 - 07/30  0700 In: 1441 [I.V.:1441] Out: 0   PHYSICAL EXAM:  Physical Exam Blood pressure 131/87, pulse 82, temperature 98.1 F (36.7 C), temperature source Oral, resp. rate 16, height '6\' 1"'$  (1.854 m), weight 104.3 kg, SpO2 97 %. Last Weight  Most recent update: 05/14/2021 11:55 AM    Weight  104.3 kg (230 lb)             CONSTITUTIONAL: Well developed, and nourished, appropriately responsive and aware without distress.   EYES: Sclera non-icteric.   EARS, NOSE, MOUTH AND THROAT: The oropharynx is clear. Oral mucosa is pink and moist.    Hearing is intact to voice.  NECK: Trachea is midline, and there is no jugular venous distension.  LYMPH NODES:  Lymph nodes in the neck are not enlarged. RESPIRATORY:  Lungs are clear, and breath sounds are equal bilaterally. Normal respiratory effort without pathologic use of accessory muscles. CARDIOVASCULAR: Heart is regular in rate and rhythm. GI: The abdomen is soft, nontender, and nondistended.  Lower midline scar.  There were no palpable masses. I did not appreciate hepatosplenomegaly. There were normal bowel sounds. GU: DRE was completed, there is a tiny sentinel tag at 12:00, correlating to the coccyx.  There were no other external hemorrhoids present, there were no remarkable hemorrhoidal piles with grade 2 hemorrhoids on exam.  No remarkable redundancy.  There may be a small fissure associated with the tag.  There is no gross blood on my glove after the exam.  No appreciable distal rectal pathology. MUSCULOSKELETAL:  Symmetrical muscle tone appreciated in all four extremities.    SKIN: Skin turgor is normal. No pathologic skin lesions appreciated.  NEUROLOGIC:  Motor and sensation appear grossly normal.  Cranial nerves are grossly without defect. PSYCH:  Alert and oriented to person, place and time. Affect is appropriate for situation.  Data Reviewed I have personally reviewed what is currently available of the patient's imaging, recent labs and  medical records.    Labs:  CBC Latest Ref Rng & Units 05/15/2021 05/15/2021 05/14/2021  WBC 4.0 - 10.5 K/uL 5.8 5.6 -  Hemoglobin 13.0 - 17.0 g/dL 13.9 12.3(L) 13.0  Hematocrit 39.0 - 52.0 % 40.7 37.2(L) 38.0(L)  Platelets 150 - 400 K/uL 261 244 -   CMP Latest Ref Rng & Units 05/15/2021 05/13/2021  Glucose 70 - 99 mg/dL 103(H) 113(H)  BUN 6 - 20 mg/dL 10 17  Creatinine 0.61 - 1.24 mg/dL 0.87 0.95  Sodium 135 - 145 mmol/L 138 138  Potassium 3.5 - 5.1 mmol/L 3.6 3.6  Chloride 98 - 111 mmol/L 109 107  CO2 22 - 32 mmol/L 26 26  Calcium 8.9 - 10.3 mg/dL 8.3(L) 9.3  Total Protein 6.5 - 8.1 g/dL - 6.2(L)  Total Bilirubin 0.3 - 1.2 mg/dL - 0.6  Alkaline Phos 38 - 126 U/L - 44  AST 15 - 41 U/L - 18  ALT 0 - 44 U/L - 17     Imaging  studies:  Radiology review:  CLINICAL DATA:  GI bleeding, bilateral lower abdominal pain   EXAM: CTA ABDOMEN AND PELVIS WITHOUT AND WITH CONTRAST   TECHNIQUE: Multidetector CT imaging of the abdomen and pelvis was performed using the standard protocol during bolus administration of intravenous contrast. Multiplanar reconstructed images and MIPs were obtained and reviewed to evaluate the vascular anatomy.   CONTRAST:  129m OMNIPAQUE IOHEXOL 350 MG/ML SOLN   COMPARISON:  None.   FINDINGS: VASCULAR   Aorta: Normal caliber aorta without aneurysm, dissection, vasculitis or significant stenosis.   Celiac: Patent without evidence of aneurysm, dissection, vasculitis or significant stenosis.   SMA: Patent. No evidence of aneurysm, dissection or vasculitis. No flow-limiting stenosis or occlusion.   Renals: Single renal arteries bilaterally. Both renal arteries are patent without evidence of aneurysm, dissection, vasculitis, fibromuscular dysplasia or significant stenosis.   IMA: Vessel normally opacified. No acute luminal abnormality. No significant stenosis or occlusion. No aneurysm or ectasia or features of vasculitis.   Inflow: Patent without  evidence of aneurysm, dissection, vasculitis or significant stenosis.   Proximal Outflow: Bilateral common femoral and visualized portions of the superficial and profunda femoral arteries are patent without evidence of aneurysm, dissection, vasculitis or significant stenosis.   Veins: No obvious venous abnormality within the limitations of this arterial phase study.   Review of the MIP images confirms the above findings.   NON-VASCULAR   Lower chest: Atelectatic changes in the otherwise clear lung bases.   Hepatobiliary: No worrisome focal liver lesions. Smooth liver surface contour. Normal hepatic attenuation. Normal gallbladder and biliary tree.   Pancreas: No pancreatic ductal dilatation or surrounding inflammatory changes.   Spleen: Normal in size. No concerning splenic lesions.   Adrenals/Urinary Tract: Normal adrenal glands. Kidneys are normally located with symmetric enhancement. No suspicious renal lesion, urolithiasis or hydronephrosis. Postsurgical changes and indentation of the right anterolateral bladder, likely related to a right inguinal hernia repair.   Stomach/Bowel: Distal esophagus, stomach and duodenal sweep are unremarkable. No small bowel wall thickening or dilatation. Few small distal ileal noninflamed diverticula. Small focus of arterial contrast enhancement associated with the right-sided cecal diverticulum (2061-07-22 which appears stable on the more delayed venous phase imaging, could reflect enhancement or minimal extravasation versus artifact due to adjacent bowel gas. Additional numerous colonic diverticula throughout the colonic segments without worrisome focal Peri diverticular inflammation colonic thickening or dilatation. Fluid-filled appearance of the colon. Serpentine hyperattenuation towards the level of the anorectal verge on the arterial phase of imaging (Jul 22, 1997 appears less pronounced on the venous phase imaging. Appearance suggestive of  hemorrhoidal enhancement with small site of extravasation less favored.   Lymphatic: No significant vascular findings are present. No enlarged abdominal or pelvic lymph nodes.   Reproductive: Vasectomy clips noted bilaterally. The prostate and seminal vesicles are unremarkable.   Other: Postsurgical changes in the right inguinal region, possible hernia repair. Additional vertical midline incision anteriorly. Focal subcutaneous nodular thickening in the left flank, could reflect contusive change or injectable use.   Musculoskeletal: Multilevel degenerative changes are present in the imaged portions of the spine, hips and pelvis. No acute osseous abnormality or suspicious osseous lesion.   IMPRESSION: VASCULAR   Two sites of questionable contrast blush including a small focus of arterial enhancement at the level of a cecal diverticulum persisting on the venous phase of imaging. Could reflect bleeding at a noninflamed diverticulum versus artifact due to adjacent bowel gas.   Second site is is a more serpentine focus of enhancement diffusing  on the venous phase imaging at the level of the anorectal verge. Such an appearance is fairly typical for hemorrhoidal enhancement with small bleed less favored though could correlate with rectal exam.   No other acute or conspicuous vascular findings.   NON-VASCULAR   Diffusely fluid-filled appearance of the colon, correlate for rapid transit state/diarrheal illness.   Postsurgical changes in the right inguinal region with scarring about the right anterolateral bladder, correlate with surgical history for possible prior hernia repair.   Prior vasectomy.     Electronically Signed   By: Lovena Le M.D.   On: 05/14/2021 00:36  Last 24 hrs: No results found.   Assessment/Plan:  50 y.o. male with rectal bleeding, complicated by pertinent comorbidities including:  Patient Active Problem List   Diagnosis Date Noted   Rectal bleed  05/14/2021   Lower GI bleed    Hypotension     -Current issues include failure to definitively localize source.  Fortunately he appears hemodynamically stable, without evidence of active hemorrhage.  Would continue n.p.o. until tagged RBC scan complete.  If negative may resume clear liquid diet.  -Appreciate GI input.  -Due to concerns of persistent/recurrent bleeding would ensure IV fluids are keeping him euvolemic.  - DVT prophylaxis Extensive discussion with patient and spouse present in the room, dilemma is inability to intervene until better localize.  Exam under anesthesia I do not believe is necessary at present due to the lack of gross blood on my exam. If another location is the source of bleeding, this may be self-limiting i.e. diverticular in etiology.  We will follow with you. All of the above findings and recommendations were discussed with the patient and family(if present), and all of patient's and present family's questions were answered to their expressed satisfaction.  Thank you for the opportunity to participate in this patient's care.   -- Ronny Bacon, M.D., FACS 05/15/2021, 12:59 PM

## 2021-05-16 ENCOUNTER — Inpatient Hospital Stay: Payer: BC Managed Care – PPO | Admitting: Anesthesiology

## 2021-05-16 ENCOUNTER — Encounter: Payer: Self-pay | Admitting: Internal Medicine

## 2021-05-16 ENCOUNTER — Encounter
Admission: EM | Disposition: A | Discharge status: Home / Self Care | Payer: Self-pay | Source: Home / Self Care | Attending: Internal Medicine

## 2021-05-16 DIAGNOSIS — K921 Melena: Secondary | ICD-10-CM

## 2021-05-16 DIAGNOSIS — K644 Residual hemorrhoidal skin tags: Secondary | ICD-10-CM

## 2021-05-16 DIAGNOSIS — R58 Hemorrhage, not elsewhere classified: Secondary | ICD-10-CM

## 2021-05-16 DIAGNOSIS — K6289 Other specified diseases of anus and rectum: Secondary | ICD-10-CM

## 2021-05-16 DIAGNOSIS — D62 Acute posthemorrhagic anemia: Secondary | ICD-10-CM

## 2021-05-16 DIAGNOSIS — K91840 Postprocedural hemorrhage and hematoma of a digestive system organ or structure following a digestive system procedure: Secondary | ICD-10-CM

## 2021-05-16 DIAGNOSIS — K625 Hemorrhage of anus and rectum: Secondary | ICD-10-CM

## 2021-05-16 HISTORY — PX: FLEXIBLE SIGMOIDOSCOPY: SHX5431

## 2021-05-16 LAB — CBC
HCT: 32.7 % — ABNORMAL LOW (ref 39.0–52.0)
HCT: 30 % — ABNORMAL LOW (ref 39.0–52.0)
Hemoglobin: 11.1 g/dL — ABNORMAL LOW (ref 13.0–17.0)
Hemoglobin: 10.7 g/dL — ABNORMAL LOW (ref 13.0–17.0)
MCH: 31.4 pg (ref 26.0–34.0)
MCH: 33.4 pg (ref 26.0–34.0)
MCHC: 33.9 g/dL (ref 30.0–36.0)
MCHC: 35.7 g/dL (ref 30.0–36.0)
MCV: 92.4 fL (ref 80.0–100.0)
MCV: 93.8 fL (ref 80.0–100.0)
Platelets: 246 10*3/uL (ref 150–400)
Platelets: 261 10*3/uL (ref 150–400)
RBC: 3.54 MIL/uL — ABNORMAL LOW (ref 4.22–5.81)
RBC: 3.2 MIL/uL — ABNORMAL LOW (ref 4.22–5.81)
RDW: 12.8 % (ref 11.5–15.5)
RDW: 12.7 % (ref 11.5–15.5)
WBC: 5.5 10*3/uL (ref 4.0–10.5)
WBC: 6.9 10*3/uL (ref 4.0–10.5)
nRBC: 0 % (ref 0.0–0.2)
nRBC: 0 % (ref 0.0–0.2)

## 2021-05-16 SURGERY — SIGMOIDOSCOPY, FLEXIBLE
Anesthesia: General

## 2021-05-16 MED ORDER — FLEET ENEMA 7-19 GM/118ML RE ENEM
1.0000 | ENEMA | Freq: Once | RECTAL | Status: AC
  Administered 2021-05-16: 1 via RECTAL

## 2021-05-16 MED ORDER — LACTATED RINGERS IV SOLN: INTRAVENOUS | Status: DC | PRN

## 2021-05-16 MED ORDER — PROPOFOL 500 MG/50ML IV EMUL
INTRAVENOUS | Status: AC
  Filled 2021-05-16: qty 50

## 2021-05-16 MED ORDER — ONDANSETRON HCL 4 MG/2ML IJ SOLN: 4.0000 mg | Freq: Once | INTRAMUSCULAR | Status: DC | PRN

## 2021-05-16 MED ORDER — PROPOFOL 10 MG/ML IV BOLUS
INTRAVENOUS | Status: DC | PRN
  Administered 2021-05-16 (×4): 50 mg via INTRAVENOUS
  Administered 2021-05-16: 80 mg via INTRAVENOUS

## 2021-05-16 NOTE — Anesthesia Postprocedure Evaluation (Signed)
Anesthesia Post Note  Patient: Mate Rodeffer  Procedure(s) Performed: [place]  Patient location during evaluation: PACU Anesthesia Type: General Level of consciousness: awake and alert Pain management: pain level controlled Vital Signs Assessment: post-procedure vital signs reviewed and stable Respiratory status: spontaneous breathing, nonlabored ventilation, respiratory function stable and patient connected to nasal cannula oxygen Cardiovascular status: blood pressure returned to baseline and stable Postop Assessment: no apparent nausea or vomiting Anesthetic complications: no   No notable events documented.   Last Vitals:  Vitals:   05/16/21 0920 05/16/21 0934  BP: 125/80 117/79  Pulse: 80 71  Resp: 12   Temp: 36.6 C   SpO2: 100%     Last Pain:  Vitals:   05/16/21 0920  TempSrc:   PainSc: 0-No pain                 Arita Miss

## 2021-05-16 NOTE — Transfer of Care (Signed)
Immediate Anesthesia Transfer of Care Note  Patient: Brian Wilson  Procedure(s) Performed: FLEXIBLE SIGMOIDOSCOPY  Patient Location: PACU  Anesthesia Type:General  Level of Consciousness: awake, alert  and oriented  Airway & Oxygen Therapy: Patient Spontanous Breathing  Post-op Assessment: Report given to RN and Post -op Vital signs reviewed and stable  Post vital signs: Reviewed and stable  Last Vitals:  Vitals Value Taken Time  BP 106/60 05/16/21 0907  Temp 36.4 C 05/16/21 0908  Pulse 86 05/16/21 0911  Resp 18 05/16/21 0911  SpO2 100 % 05/16/21 0911  Vitals shown include unvalidated device data.  Last Pain:  Vitals:   05/16/21 0908  TempSrc:   PainSc: 0-No pain      Patients Stated Pain Goal: 2 ([id number])  Complications: No notable events documented.

## 2021-05-16 NOTE — Progress Notes (Signed)
PROGRESS NOTE    Brian Wilson  [id number] DOB: 1971-02-02 DOA: 05/13/2021 PCP: Sallee Provencal, MD   Brief Narrative: Taken from prior notes. Brian Wilson is a 50 y.o. male with a history of hemorrhoids who presents to the ED with onset of GI bleeding on the night of arrival.  Patient had 2 large maroon-colored bowel movements and became lightheaded and diaphoretic.  He denied shortness of breath palpitations or associated chest pain.  Denies abdominal pain.  Patient had softer blood pressure when EMS arrived which responded well to fluid resuscitation.   Hemodynamically stable, labs with hemoglobin of 13.8 and otherwise unremarkable, CT abdomen and pelvis with and without contrast with 2 questionable areas of contrast blush so vascular surgery was also consulted but they does not think that he needs any embolization.   Patient underwent colonoscopy with GI, multiple polyps were removed and sent for pathology.  There was no active bleeding and it was thought to be either a diverticular bleed or bleeding with internal hemorrhoids.  GI is recommending outpatient follow-up for internal hemorrhoidal banding.  Patient again developed bloody bowel movements since this morning.  GI recommended if a general surgery can help as there is concern of hemorrhoidal bleed. Tagged RBC scan was without any definitive bleeding site, hemoglobin dropped to 11.1 Repeat sigmoidoscopy with bleeding from polypectomy sites-2 clips placed.  Subjective: Patient had 1 more bloody bowel movement earlier.  Was seen after sigmoidoscopy where 2 clips were placed.  Did not had any more bowel movement since then.  Asking for food.  No abdominal pain, nausea or vomiting.  Wife at bedside.  Assessment & Plan:   Active Problems:   Rectal bleed   Lower GI bleed   Hypotension   Hemorrhage of rectum and anus   Colonoscopy causing post-procedural bleeding   Hypertrophy of anal papillae   Residual hemorrhoidal skin  tags  Lower GI bleed/internal hemorrhoidal versus diverticular.  Patient underwent colonoscopy with removal of few polyps which were sent for pathology.  There were nonbleeding internal hemorrhoids.  Concern of hemorrhoidal versus diverticular bleed.  Hemoglobin remained stable but patient was complaining of 3 bloody bowel movements since this morning.  General surgery was also consulted at request of GI and they would like to wait for tagged RBC scan results. RBC scan was without any significant bleeding site.  Repeat sigmoidoscopy with bleeding from polypectomy sites s/p 2 clips placed. GI wants to monitor for another day -Monitor hemoglobin. -Start him on full liquid diet. -Continue with Protonix  Objective: Vitals:   05/16/21 0908 05/16/21 0915 05/16/21 0920 05/16/21 0934  BP:  117/77 125/80 117/79  Pulse:  76 80 71  Resp:  15 12   Temp: 97.6 F (36.4 C)  97.8 F (36.6 C)   TempSrc:      SpO2:  100% 100%   Weight:      Height:        Intake/Output Summary (Last 24 hours) at 05/16/2021 1432 Last data filed at 05/16/2021 1300 Gross per 24 hour  Intake 840 ml  Output --  Net 840 ml    Filed Weights   05/13/21 2308 05/14/21 1154  Weight: 104.3 kg 104.3 kg    Examination:  General.  Well-developed gentleman, in no acute distress. Pulmonary.  Lungs clear bilaterally, normal respiratory effort. CV.  Regular rate and rhythm, no JVD, rub or murmur. Abdomen.  Soft, nontender, nondistended, BS positive. CNS.  Alert and oriented x3.  No focal neurologic deficit. Extremities.  No edema, no cyanosis, pulses intact and symmetrical. Psychiatry.  Judgment and insight appears normal.   DVT prophylaxis: SCDs Code Status: Full Family Communication: Wife was updated at bedside Disposition Plan:  Status is: Inpatient  Remains inpatient appropriate because:Inpatient level of care appropriate due to severity of illness  Dispo: The patient is from: Home              Anticipated d/c  is to: Home              Patient currently is not medically stable to d/c.   Difficult to place patient No              Level of care: Med-Surg  All the records are reviewed and case discussed with Care Management/Social Worker. Management plans discussed with the patient, nursing and they are in agreement.  Consultants:  Gastroenterology Vascular surgery General surgery  Procedures:  Antimicrobials:   Data Reviewed: I have personally reviewed following labs and imaging studies  CBC: Recent Labs  Lab 05/13/21 2311 05/14/21 0508 05/14/21 1510 05/15/21 0415 05/15/21 1135 05/15/21 1832 05/16/21 0401  WBC 9.0  --   --  5.6 5.8 6.3 5.5  HGB 13.8   < > 13.0 12.3* 13.9 12.5* 11.1*  HCT 40.5   < > 38.0* 37.2* 40.7 36.5* 32.7*  MCV 92.5  --   --  93.0 95.5 93.8 92.4  PLT 266  --   --  244 261 269 246   < > = values in this interval not displayed.    Basic Metabolic Panel: Recent Labs  Lab 05/13/21 2311 05/15/21 0415  NA 138 138  K 3.6 3.6  CL 107 109  CO2 26 26  GLUCOSE 113* 103*  BUN 17 10  CREATININE 0.95 0.87  CALCIUM 9.3 8.3*    GFR: Estimated Creatinine Clearance: 130.3 mL/min (by C-G formula based on SCr of 0.87 mg/dL). Liver Function Tests: Recent Labs  Lab 05/13/21 2311  AST 18  ALT 17  ALKPHOS 44  BILITOT 0.6  PROT 6.2*  ALBUMIN 3.9    No results for input(s): LIPASE, AMYLASE in the last 168 hours. No results for input(s): AMMONIA in the last 168 hours. Coagulation Profile: Recent Labs  Lab 05/13/21 2311  INR 1.0    Cardiac Enzymes: No results for input(s): CKTOTAL, CKMB, CKMBINDEX, TROPONINI in the last 168 hours. BNP (last 3 results) No results for input(s): PROBNP in the last 8760 hours. HbA1C: No results for input(s): HGBA1C in the last 72 hours. CBG: No results for input(s): GLUCAP in the last 168 hours. Lipid Profile: No results for input(s): CHOL, HDL, LDLCALC, TRIG, CHOLHDL, LDLDIRECT in the last 72 hours. Thyroid Function  Tests: No results for input(s): TSH, T4TOTAL, FREET4, T3FREE, THYROIDAB in the last 72 hours. Anemia Panel: No results for input(s): VITAMINB12, FOLATE, FERRITIN, TIBC, IRON, RETICCTPCT in the last 72 hours. Sepsis Labs: No results for input(s): PROCALCITON, LATICACIDVEN in the last 168 hours.  Recent Results (from the past 240 hour(s))  Resp Panel by RT-PCR (Flu A&B, Covid) Nasopharyngeal Swab     Status: None   Collection Time: 05/14/21  1:09 AM   Specimen: Nasopharyngeal Swab; Nasopharyngeal(NP) swabs in vial transport medium  Result Value Ref Range Status   SARS Coronavirus 2 by RT PCR NEGATIVE NEGATIVE Final    Comment: (NOTE) SARS-CoV-2 target nucleic acids are NOT DETECTED.  The SARS-CoV-2 RNA is generally detectable in upper respiratory specimens during the acute phase of infection. The  lowest concentration of SARS-CoV-2 viral copies this assay can detect is 138 copies/mL. A negative result does not preclude SARS-Cov-2 infection and should not be used as the sole basis for treatment or other patient management decisions. A negative result may occur with  improper specimen collection/handling, submission of specimen other than nasopharyngeal swab, presence of viral mutation(s) within the areas targeted by this assay, and inadequate number of viral copies(<138 copies/mL). A negative result must be combined with clinical observations, patient history, and epidemiological information. The expected result is Negative.  Fact Sheet for Patients:  EntrepreneurPulse.com.au  Fact Sheet for Healthcare Providers:  IncredibleEmployment.be  This test is no t yet approved or cleared by the [place] FDA and  has been authorized for detection and/or diagnosis of SARS-CoV-2 by FDA under an Emergency Use Authorization (EUA). This EUA will remain  in effect (meaning this test can be used) for the duration of the COVID-19 declaration under Section  564(b)(1) of the Act, 21 U.S.C.section 360bbb-3(b)(1), unless the authorization is terminated  or revoked sooner.       Influenza A by PCR NEGATIVE NEGATIVE Final   Influenza B by PCR NEGATIVE NEGATIVE Final    Comment: (NOTE) The Xpert Xpress SARS-CoV-2/FLU/RSV plus assay is intended as an aid in the diagnosis of influenza from Nasopharyngeal swab specimens and should not be used as a sole basis for treatment. Nasal washings and aspirates are unacceptable for Xpert Xpress SARS-CoV-2/FLU/RSV testing.  Fact Sheet for Patients: EntrepreneurPulse.com.au  Fact Sheet for Healthcare Providers: IncredibleEmployment.be  This test is not yet approved or cleared by the [place] FDA and has been authorized for detection and/or diagnosis of SARS-CoV-2 by FDA under an Emergency Use Authorization (EUA). This EUA will remain in effect (meaning this test can be used) for the duration of the COVID-19 declaration under Section 564(b)(1) of the Act, 21 U.S.C. section 360bbb-3(b)(1), unless the authorization is terminated or revoked.  Performed at [facility], [address] [place], [place] [address]       Radiology Studies: [place] GI Blood Loss  Result Date: 05/15/2021 CLINICAL DATA:  Lower GI bleed. EXAM: NUCLEAR MEDICINE GASTROINTESTINAL BLEEDING SCAN TECHNIQUE: Sequential abdominal images were obtained following intravenous administration of Tc-72mlabeled red blood cells. RADIOPHARMACEUTICALS:  21.72 mCi Tc-924mertechnetate in-vitro labeled red cells. COMPARISON:  Abdominal CTA yesterday. FINDINGS: There is no accumulation of radiotracer in the GI tract to localize site of GI bleed. Particularly, no right lower quadrant accumulation of tracer at site of question bleed on CTA. Physiologic distribution of radiotracer including the urinary bladder and genitalia. IMPRESSION: No scintigraphic localization of GI bleed. Electronically Signed   By:  MeKeith Rake.D.   On: 05/15/2021 18:32    Scheduled Meds:  pantoprazole (PROTONIX) IV  40 mg Intravenous Q24H   Continuous Infusions:  sodium chloride Stopped (05/16/21 0854)   lactated ringers Stopped (05/15/21 2239)     LOS: 2 days   Time spent: 33 minutes. More than 50% of the time was spent in counseling/coordination of care  SuLorella NimrodMD [facility]  If 7PM-7AM, please contact night-coverage [email or web address]  05/16/2021, 2:32 PM   This record has been created using DrSystems analystErrors have been sought and corrected,but may not always be located. Such creation errors do not reflect on the standard of care.

## 2021-05-16 NOTE — Anesthesia Preprocedure Evaluation (Signed)
Anesthesia Evaluation  Patient identified by MRN, date of birth, ID band Patient awake    Reviewed: Allergy & Precautions, NPO status , Patient's Chart, lab work & pertinent test results  History of Anesthesia Complications Negative for: history of anesthetic complications  Airway Mallampati: II  TM Distance: >3 FB Neck ROM: Full    Dental no notable dental hx. (+) Teeth Intact   Pulmonary neg pulmonary ROS, neg sleep apnea, neg COPD, Patient abstained from smoking.Not current smoker,    Pulmonary exam normal breath sounds clear to auscultation- rhonchi (-) wheezing      Cardiovascular Exercise Tolerance: Good METS(-) hypertension(-) CAD and (-) Past MI negative cardio ROS Normal cardiovascular exam(-) dysrhythmias  Rhythm:Regular Rate:Normal - Systolic murmurs and - Diastolic murmurs    Neuro/Psych negative neurological ROS  negative psych ROS   GI/Hepatic Neg liver ROS, neg GERD  ,ED on 7/28 with rectal bleeding - new onset GIB  05/14/21 CT Abd/Pelvis: IMPRESSION: VASCULAR  Two sites of questionable contrast blush including a small focus of arterial enhancement at the level of a cecal diverticulum persisting on the venous phase of imaging. Could reflect bleeding at a noninflamed diverticulum versus artifact due to adjacent bowel gas.  Second site is is a more serpentine focus of enhancement diffusing on the venous phase imaging at the level of the anorectal verge. Such an appearance is fairly typical for hemorrhoidal enhancement with small bleed less favored though could correlate with rectal exam.  No other acute or conspicuous vascular findings.  NON-VASCULAR  Diffusely fluid-filled appearance of the colon, correlate for rapid transit state/diarrheal illness.  Postsurgical changes in the right inguinal region with scarring about the right anterolateral bladder, correlate with surgical history for possible prior  hernia repair.  Prior vasectomy.   Endo/Other  negative endocrine ROSneg diabetes  Renal/GU negative Renal ROS  negative genitourinary   Musculoskeletal negative musculoskeletal ROS (+)   Abdominal Normal abdominal exam  (+)   Peds negative pediatric ROS (+)  Hematology negative hematology ROS (+)   Anesthesia Other Findings History reviewed. No pertinent past medical history.  Reproductive/Obstetrics                             Anesthesia Physical  Anesthesia Plan  ASA: 2  Anesthesia Plan: General   Post-op Pain Management:    Induction: Intravenous  PONV Risk Score and Plan: 2 and Propofol infusion, TIVA and Ondansetron  Airway Management Planned: Natural Airway  Additional Equipment: None  Intra-op Plan:   Post-operative Plan:   Informed Consent: I have reviewed the patients History and Physical, chart, labs and discussed the procedure including the risks, benefits and alternatives for the proposed anesthesia with the patient or authorized representative who has indicated his/her understanding and acceptance.     Dental advisory given  Plan Discussed with: CRNA and Anesthesiologist  Anesthesia Plan Comments: (Discussed risks of anesthesia with patient, including possibility of difficulty with spontaneous ventilation under anesthesia necessitating airway intervention, PONV, and rare risks such as cardiac or respiratory or neurological events. Patient understands.)        Anesthesia Quick Evaluation

## 2021-05-16 NOTE — Progress Notes (Addendum)
Brian Antigua, MD [address], [place], [place], [place], [address] [address], [place], [place], [place], [address] Phone: [phone]  Fax: [phone]   Subjective: Patient laying in bed comfortably.  Family at bedside.  They report that symptoms had initially resolved after his colonoscopy and he did not have any Bms on the day of his procedure.   However, patient has continued to have hematochezia starting yesterday morning.  States had another episode this morning.  States had about 10 episodes all day yesterday.  Denies any nausea or vomiting or abdominal pain.  Patient presented with hematochezia initially and underwent colonoscopy 2 days ago that did not show any active bleeding.  Diverticulosis and internal hemorrhoids were noted and these were thought to be the likely source of his presentation.  2 colon polyps were removed from the transverse colon and sigmoid colon via cold snare.  One in the rectum was removed via EMR.  A clip was placed at the EMR site to prevent bleeding.  Please see procedure report by Dr. Vicente Males for further details.  GI had signed off on the patient due to resolution of symptoms after the above colonoscopy.  I was re-called for evaluation of recurrent bleeding  I discussed the case with primary team and Due to recurrent bleeding, recommended RBC scan, and advised making vascular surgery aware since they had initially seen the patient as well. RBC scan was done and this was negative.  I recommended surgical eval given the possibility of his initial presentation thought to be from hemorrhoids. Dr. Christian Mate from surgery did see the patient yesterday, but patient did not have any evidence of active hemorrhage as per his note and conservative management was recommended from a surgical standpoint.   Objective: Exam: Vital signs in last 24 hours: Vitals:   05/15/21 1730 05/15/21 1745 05/15/21 1800 05/15/21 2107  BP: 122/69 123/66 121/69 124/84   Pulse:    86  Resp:    20  Temp:    97.8 F (36.6 C)  TempSrc:      SpO2:    99%  Weight:      Height:       Weight change:   Intake/Output Summary (Last 24 hours) at 05/16/2021 0755 Last data filed at 05/15/2021 1300 Gross per 24 hour  Intake 360 ml  Output --  Net 360 ml    Constitutional: General:   Alert,  Well-developed, well-nourished, pleasant and cooperative in NAD BP 124/84 (BP Location: Right Arm)   Pulse 86   Temp 97.8 F (36.6 C)   Resp 20   Ht '6\' 1"'$  (1.854 m)   Wt 104.3 kg   SpO2 99%   BMI 30.34 kg/m   Eyes:  Sclera clear, no icterus.   Conjunctiva pink.   Ears:  No scars, lesions or masses, Normal auditory acuity. Nose:  No deformity, discharge, or lesions. Mouth:  No deformity or lesions, oropharynx pink & moist.  Neck:  Supple; no masses, trachea midline  Respiratory: Normal respiratory effort  Gastrointestinal:  Soft, non-tender and non-distended without masses, hepatosplenomegaly or hernias noted.  No guarding or rebound tenderness.     Rectal exam: 1 small external skin tag present, nonbleeding.  Digital rectal exam with minimal pink material in the rectum, no stool or blood on DRE.  Cardiac: No clubbing or edema.  No cyanosis. Normal posterior tibial pedal pulses noted.  Lymphatic:  No significant cervical adenopathy.  Psych:  Alert and cooperative. Normal mood and affect.  Musculoskeletal:  Head normocephalic, atraumatic. 5/5 Lower extremity strength bilaterally.  Skin: Warm. Intact without significant lesions or rashes. No jaundice.  Neurologic:  Face symmetrical, tongue midline, Normal sensation to touch  Psych:  Alert and oriented x3, Alert and cooperative. Normal mood and affect.   Lab Results: Lab Results  Component Value Date   WBC 5.5 05/16/2021   HGB 11.1 (L) 05/16/2021   HCT 32.7 (L) 05/16/2021   MCV 92.4 05/16/2021   PLT 246 05/16/2021   Micro Results: Recent Results (from the past 240 hour(s))  Resp Panel by  RT-PCR (Flu A&B, Covid) Nasopharyngeal Swab     Status: None   Collection Time: 05/14/21  1:09 AM   Specimen: Nasopharyngeal Swab; Nasopharyngeal(NP) swabs in vial transport medium  Result Value Ref Range Status   SARS Coronavirus 2 by RT PCR NEGATIVE NEGATIVE Final    Comment: (NOTE) SARS-CoV-2 target nucleic acids are NOT DETECTED.  The SARS-CoV-2 RNA is generally detectable in upper respiratory specimens during the acute phase of infection. The lowest concentration of SARS-CoV-2 viral copies this assay can detect is 138 copies/mL. A negative result does not preclude SARS-Cov-2 infection and should not be used as the sole basis for treatment or other patient management decisions. A negative result may occur with  improper specimen collection/handling, submission of specimen other than nasopharyngeal swab, presence of viral mutation(s) within the areas targeted by this assay, and inadequate number of viral copies(<138 copies/mL). A negative result must be combined with clinical observations, patient history, and epidemiological information. The expected result is Negative.  Fact Sheet for Patients:  EntrepreneurPulse.com.au  Fact Sheet for Healthcare Providers:  IncredibleEmployment.be  This test is no t yet approved or cleared by the [place] FDA and  has been authorized for detection and/or diagnosis of SARS-CoV-2 by FDA under an Emergency Use Authorization (EUA). This EUA will remain  in effect (meaning this test can be used) for the duration of the COVID-19 declaration under Section 564(b)(1) of the Act, 21 U.S.C.section 360bbb-3(b)(1), unless the authorization is terminated  or revoked sooner.       Influenza A by PCR NEGATIVE NEGATIVE Final   Influenza B by PCR NEGATIVE NEGATIVE Final    Comment: (NOTE) The Xpert Xpress SARS-CoV-2/FLU/RSV plus assay is intended as an aid in the diagnosis of influenza from Nasopharyngeal swab  specimens and should not be used as a sole basis for treatment. Nasal washings and aspirates are unacceptable for Xpert Xpress SARS-CoV-2/FLU/RSV testing.  Fact Sheet for Patients: EntrepreneurPulse.com.au  Fact Sheet for Healthcare Providers: IncredibleEmployment.be  This test is not yet approved or cleared by the [place] FDA and has been authorized for detection and/or diagnosis of SARS-CoV-2 by FDA under an Emergency Use Authorization (EUA). This EUA will remain in effect (meaning this test can be used) for the duration of the COVID-19 declaration under Section 564(b)(1) of the Act, 21 U.S.C. section 360bbb-3(b)(1), unless the authorization is terminated or revoked.  Performed at [facility], [address] [place], [place] [address]    Studies/Results: [place] GI Blood Loss  Result Date: 05/15/2021 CLINICAL DATA:  Lower GI bleed. EXAM: NUCLEAR MEDICINE GASTROINTESTINAL BLEEDING SCAN TECHNIQUE: Sequential abdominal images were obtained following intravenous administration of Tc-52mlabeled red blood cells. RADIOPHARMACEUTICALS:  21.72 mCi Tc-971mertechnetate in-vitro labeled red cells. COMPARISON:  Abdominal CTA yesterday. FINDINGS: There is no accumulation of radiotracer in the GI tract to localize site of GI bleed. Particularly, no right lower quadrant accumulation of tracer at site  of question bleed on CTA. Physiologic distribution of radiotracer including the urinary bladder and genitalia. IMPRESSION: No scintigraphic localization of GI bleed. Electronically Signed   By: Keith Rake M.D.   On: 05/15/2021 18:32   Medications:  Scheduled Meds:  pantoprazole (PROTONIX) IV  40 mg Intravenous Q24H   sodium phosphate  1 enema Rectal Once   Continuous Infusions:  sodium chloride 125 mL/hr at 05/16/21 0255   lactated ringers Stopped (05/15/21 2239)   PRN Meds:.acetaminophen **OR** acetaminophen, ondansetron **OR** ondansetron  (ZOFRAN) IV   Assessment: Active Problems:   Rectal bleed   Lower GI bleed   Hypotension    Plan: Patient's hematochezia had initially resolved after his colonoscopy and therefore, GI team had signed off.    However, patient has had recurrent bleeding starting yesterday morning (and GI was recalled for further evaluation and management), and now also has associated anemia, which was not present prior to his colonoscopy 2 days ago.    Patient continues to have hematochezia since yesterday morning leading into today. RBC scan was done due to recurrent bleeding and this was negative.   Differentials include bleeding from internal hemorrhoids seen on colonoscopy 2 days ago, versus bleeding from EMR polypectomy site in the rectum.  There was a clip placed at the polypectomy site, therefore the risk of bleeding from that site is low, but still possible  Given ongoing bleeding, with flowsheets documenting type VII red stool multiple times yesterday, further evaluation is indicated at this time  Patient also has a picture on his phone from the day of his presentation that he showed me, with the whole toilet bowl water being red.  He states his bowel movements yesterday and today have looked the same.  Given the acute change in clinical status, would recommend proceeding with flexible sigmoidoscopy today to evaluate for source of bleeding from hemorrhoids versus polypectomy site.   This is unlikely to be diverticular bleeding, given negative RBC scan yesterday, and colonoscopy not showing any old or new blood during the procedure.  However, if source is unclear after flexible sigmoidoscopy, may need to consider colonoscopy as well  I have discussed alternative options, risks & benefits,  which include, but are not limited to, bleeding, infection, perforation,respiratory complication & drug reaction.  The patient agrees with this plan & written consent will be obtained.    Patient is otherwise  hemodynamically stable.  Fleet enema ordered and nursing staff advised of the plan as well  Continue to monitor serial CBCs and transfuse as needed.  Patient has not needed a blood transfusion so far  No evidence of upper GI bleed at this time Avoid NSAIDs Maintain 2 large-bore IV lines Please page GI with any acute hemodynamic changes, or signs of active GI bleeding   Above plan was also discussed with Dr. Reesa Chew and Dr. Christian Mate    LOS: 2 days   Brian Antigua, MD 05/16/2021, 7:55 AM

## 2021-05-16 NOTE — Op Note (Signed)
[facility] Gastroenterology Patient Name: Brian Wilson Procedure Date: 05/16/2021 8:44 AM MRN: [id number] Account #: [id number] Date of Birth: [birth year] Admit Type: Inpatient Age: 50 Room: [facility] ENDO ROOM 4 Gender: Male Note Status: Finalized Procedure:             Flexible Sigmoidoscopy Indications:           Hematochezia, Acute post hemorrhagic anemia Providers:             Donelda Mailhot B. Bonna Gains MD, MD Medicines:             General Anesthesia Complications:         No immediate complications. Procedure:             Pre-Anesthesia Assessment:                        - Prior to the procedure, a History and Physical was                         performed, and patient medications and allergies were                         reviewed. The patient is competent. The risks and                         benefits of the procedure and the sedation options and                         risks were discussed with the patient. All questions                         were answered and informed consent was obtained.                         Patient identification and proposed procedure were                         verified by the physician, the nurse, the                         anesthesiologist, the anesthetist and the technician                         in the pre-procedure area in the procedure room in the                         endoscopy suite. Mental Status Examination: alert and                         oriented. Airway Examination: normal oropharyngeal                         airway and neck mobility. Respiratory Examination:                         clear to auscultation. CV Examination: normal.                         Prophylactic Antibiotics: The patient does not require  prophylactic antibiotics. Prior Anticoagulants: The                         patient has taken no previous anticoagulant or                         antiplatelet agents. ASA Grade  Assessment: II - A                         patient with mild systemic disease. After reviewing                         the risks and benefits, the patient was deemed in                         satisfactory condition to undergo the procedure. The                         anesthesia plan was to use general anesthesia.                         Immediately prior to administration of medications,                         the patient was re-assessed for adequacy to receive                         sedatives. The heart rate, respiratory rate, oxygen                         saturations, blood pressure, adequacy of pulmonary                         ventilation, and response to care were monitored                         throughout the procedure. The physical status of the                         patient was re-assessed after the procedure.                        After obtaining informed consent, the scope was passed                         under direct vision. The Endoscope was introduced                         through the anus and advanced to the the rectosigmoid                         junction. The flexible sigmoidoscopy was accomplished                         with ease. The patient tolerated the procedure well.                         The quality of the bowel preparation was adequate.  Findings:      Skin tags were found on perianal exam.      Red blood was found in the rectum.      Active bleeding was seen in the rectum, secondary to previous       polypectomy procedure. To stop active bleeding, two hemostatic clips       were successfully placed. Bleeding had stopped at the end of the       procedure. A previous clip was already seen in place at the polypectomy       site in the rectum. This clip appeared to be at the top of the       polpectomy site and active bleeding was present. Two clips were placed       at the base of the polypectomy site with successful resolution of       bleeding.       Anal papilla(e) were hypertrophied. Impression:            - Perianal skin tags found on perianal exam.                        - Blood in the rectum.                        - Bleeding in the rectum secondary to previous                         polypectomy. Clips were placed.                        - Anal papilla(e) were hypertrophied.                        - No specimens collected. Recommendation:        - Resume regular diet.                        - Continue Serial CBCs and transfuse PRN                        - Return patient to hospital ward for ongoing care.                        - The findings and recommendations were discussed with                         the patient.                        - Return to GI clinic in 2 weeks.                        - The findings and recommendations were discussed with                         the patient's primary physician.                        - Continue present medications. Procedure Code(s):     --- Professional ---  [id number], 52, Sigmoidoscopy, flexible; with control of                         bleeding, any method Diagnosis Code(s):     --- Professional ---                        K62.5, Hemorrhage of anus and rectum                        K91.840, Postprocedural hemorrhage of a digestive                         system organ or structure following a digestive system                         procedure                        K62.89, Other specified diseases of anus and rectum                        K64.4, Residual hemorrhoidal skin tags                        K92.1, Melena (includes Hematochezia)                        D62, Acute posthemorrhagic anemia CPT copyright 2019 American Medical Association. All rights reserved. The codes documented in this report are preliminary and upon coder review may  be revised to meet current compliance requirements.  Vonda Antigua, MD Margretta Sidle B. Bonna Gains MD, MD 05/16/2021 10:17:58  AM This report has been signed electronically. Number of Addenda: 0 Note Initiated On: 05/16/2021 8:44 AM Estimated Blood Loss:  Estimated blood loss: none.      [facility]

## 2021-05-17 ENCOUNTER — Encounter: Payer: Self-pay | Admitting: Gastroenterology

## 2021-05-17 LAB — IRON AND TIBC
Iron: 58 ug/dL (ref 45–182)
Saturation Ratios: 17 % — ABNORMAL LOW (ref 17.9–39.5)
TIBC: 337 ug/dL (ref 250–450)
UIBC: 279 ug/dL

## 2021-05-17 LAB — CBC
HCT: 30.6 % — ABNORMAL LOW (ref 39.0–52.0)
Hemoglobin: 10.5 g/dL — ABNORMAL LOW (ref 13.0–17.0)
MCH: 31.7 pg (ref 26.0–34.0)
MCHC: 34.3 g/dL (ref 30.0–36.0)
MCV: 92.4 fL (ref 80.0–100.0)
Platelets: 277 K/uL (ref 150–400)
RBC: 3.31 MIL/uL — ABNORMAL LOW (ref 4.22–5.81)
RDW: 12.8 % (ref 11.5–15.5)
WBC: 7.6 K/uL (ref 4.0–10.5)
nRBC: 0 % (ref 0.0–0.2)

## 2021-05-17 LAB — SURGICAL PATHOLOGY

## 2021-05-17 LAB — FERRITIN: Ferritin: 143 ng/mL (ref 24–336)

## 2021-05-17 MED ORDER — FERROUS SULFATE 325 (65 FE) MG PO TBEC: 325.0000 mg | DELAYED_RELEASE_TABLET | Freq: Two times a day (BID) | ORAL | 3 refills | Status: AC

## 2021-05-17 NOTE — Discharge Summary (Signed)
Physician Discharge Summary  Brian Wilson [id number] DOB: [birth year] DOA: 05/13/2021  PCP: Sallee Provencal, MD  Admit date: 05/13/2021 Discharge date: 05/17/2021  Admitted From: Home Disposition: Home  Recommendations for Outpatient Follow-up:  Follow up with PCP in 1-2 weeks Follow-up with gastroenterology Please obtain BMP/CBC in one week Please follow up on the following pending results: Polyp biopsy results  Home Health: No Equipment/Devices: None Discharge Condition: Stable CODE STATUS:  Diet recommendation: Heart Healthy   Brief/Interim Summary: Brian Wilson is a 50 y.o. male with a history of hemorrhoids who presents to the ED with onset of GI bleeding on the night of arrival.  Patient had 2 large maroon-colored bowel movements and became lightheaded and diaphoretic.  He denied shortness of breath palpitations or associated chest pain.  Denies abdominal pain.  Patient had softer blood pressure when EMS arrived which responded well to fluid resuscitation.   Hemodynamically stable, labs with hemoglobin of 13.8 and otherwise unremarkable, CT abdomen and pelvis with and without contrast with 2 questionable areas of contrast blush so vascular surgery was also consulted but they does not think that he needs any embolization.   Patient underwent colonoscopy with GI, multiple polyps were removed and sent for pathology.  There was no active bleeding and it was thought to be either a diverticular bleed or bleeding with internal hemorrhoids.  GI is recommending outpatient follow-up for internal hemorrhoidal banding.   Next morning patient again had bloody bowel movements, hemoglobin dropped to 11.5>>10.5, tagged RBC scan was without any definitive bleeding site.  GI performed another flexible sigmoidoscopy and found to have bleeding from polypectomy sites which were clipped. General surgery was also consulted but there do not think that he needs surgery for his internal hemorrhoids  at this time.  Bleeding stopped and he was discharged on iron supplement due to low iron saturation on iron studies.  He will follow-up with GI for internal hemorrhoid banding as an outpatient procedure.  Patient remained stable and will follow-up with his providers.  Discharge Diagnoses:  Active Problems:   Rectal bleed   Lower GI bleed   Hypotension   Hemorrhage of rectum and anus   Colonoscopy causing post-procedural bleeding   Hypertrophy of anal papillae   Residual hemorrhoidal skin tags   Discharge Instructions  Discharge Instructions     Diet - low sodium heart healthy   Complete by: As directed    Discharge instructions   Complete by: As directed    Anticipate taking care of you. Please follow-up with gastroenterology as an outpatient for banding of your hemorrhoids. Seek medical attention if you experience more bleeding. Keep yourself well-hydrated and eat iron rich diet.   Increase activity slowly   Complete by: As directed       Allergies as of 05/17/2021   No Known Allergies      Medication List     TAKE these medications    cholecalciferol 25 MCG (1000 UNIT) tablet Commonly known as: VITAMIN D Take 1,000 Units by mouth daily.   ferrous sulfate 325 (65 FE) MG EC tablet Take 1 tablet (325 mg total) by mouth 2 (two) times daily.   multivitamin with minerals Tabs tablet Take 1 tablet by mouth daily.   ZINC OXIDE PO Take by mouth.        Follow-up Information     Sallee Provencal, MD. Schedule an appointment as soon as possible for a visit in 1 week(s).   Specialty: Family Medicine Contact information: [address]  [place] [address] [phone]         Jonathon Bellows, MD. Schedule an appointment as soon as possible for a visit in 1 week(s).   Specialty: Gastroenterology Contact information: [address] [place] [address] [phone]                No Known  Allergies  Consultations: Gastroenterology Vascular surgery General surgery  Procedures/Studies: NM GI Blood Loss  Result Date: 05/15/2021 CLINICAL DATA:  Lower GI bleed. EXAM: NUCLEAR MEDICINE GASTROINTESTINAL BLEEDING SCAN TECHNIQUE: Sequential abdominal images were obtained following intravenous administration of Tc-1mlabeled red blood cells. RADIOPHARMACEUTICALS:  21.72 mCi Tc-952mertechnetate in-vitro labeled red cells. COMPARISON:  Abdominal CTA yesterday. FINDINGS: There is no accumulation of radiotracer in the GI tract to localize site of GI bleed. Particularly, no right lower quadrant accumulation of tracer at site of question bleed on CTA. Physiologic distribution of radiotracer including the urinary bladder and genitalia. IMPRESSION: No scintigraphic localization of GI bleed. Electronically Signed   By: MeKeith Rake.D.   On: 05/15/2021 18:32   CT Angio Abd/Pel W and/or Wo Contrast  Result Date: 05/14/2021 CLINICAL DATA:  GI bleeding, bilateral lower abdominal pain EXAM: CTA ABDOMEN AND PELVIS WITHOUT AND WITH CONTRAST TECHNIQUE: Multidetector CT imaging of the abdomen and pelvis was performed using the standard protocol during bolus administration of intravenous contrast. Multiplanar reconstructed images and MIPs were obtained and reviewed to evaluate the vascular anatomy. CONTRAST:  10047mMNIPAQUE IOHEXOL 350 MG/ML SOLN COMPARISON:  None. FINDINGS: VASCULAR Aorta: Normal caliber aorta without aneurysm, dissection, vasculitis or significant stenosis. Celiac: Patent without evidence of aneurysm, dissection, vasculitis or significant stenosis. SMA: Patent. No evidence of aneurysm, dissection or vasculitis. No flow-limiting stenosis or occlusion. Renals: Single renal arteries bilaterally. Both renal arteries are patent without evidence of aneurysm, dissection, vasculitis, fibromuscular dysplasia or significant stenosis. IMA: Vessel normally opacified. No acute luminal abnormality. No  significant stenosis or occlusion. No aneurysm or ectasia or features of vasculitis. Inflow: Patent without evidence of aneurysm, dissection, vasculitis or significant stenosis. Proximal Outflow: Bilateral common femoral and visualized portions of the superficial and profunda femoral arteries are patent without evidence of aneurysm, dissection, vasculitis or significant stenosis. Veins: No obvious venous abnormality within the limitations of this arterial phase study. Review of the MIP images confirms the above findings. NON-VASCULAR Lower chest: Atelectatic changes in the otherwise clear lung bases. Hepatobiliary: No worrisome focal liver lesions. Smooth liver surface contour. Normal hepatic attenuation. Normal gallbladder and biliary tree. Pancreas: No pancreatic ductal dilatation or surrounding inflammatory changes. Spleen: Normal in size. No concerning splenic lesions. Adrenals/Urinary Tract: Normal adrenal glands. Kidneys are normally located with symmetric enhancement. No suspicious renal lesion, urolithiasis or hydronephrosis. Postsurgical changes and indentation of the right anterolateral bladder, likely related to a right inguinal hernia repair. Stomach/Bowel: Distal esophagus, stomach and duodenal sweep are unremarkable. No small bowel wall thickening or dilatation. Few small distal ileal noninflamed diverticula. Small focus of arterial contrast enhancement associated with the right-sided cecal diverticulum (9/14-Oct-2062hich appears stable on the more delayed venous phase imaging, could reflect enhancement or minimal extravasation versus artifact due to adjacent bowel gas. Additional numerous colonic diverticula throughout the colonic segments without worrisome focal Peri diverticular inflammation colonic thickening or dilatation. Fluid-filled appearance of the colon. Serpentine hyperattenuation towards the level of the anorectal verge on the arterial phase of imaging (06/1997/10/14ppears less pronounced on the  venous phase imaging. Appearance suggestive of hemorrhoidal enhancement with small site of  extravasation less favored. Lymphatic: No significant vascular findings are present. No enlarged abdominal or pelvic lymph nodes. Reproductive: Vasectomy clips noted bilaterally. The prostate and seminal vesicles are unremarkable. Other: Postsurgical changes in the right inguinal region, possible hernia repair. Additional vertical midline incision anteriorly. Focal subcutaneous nodular thickening in the left flank, could reflect contusive change or injectable use. Musculoskeletal: Multilevel degenerative changes are present in the imaged portions of the spine, hips and pelvis. No acute osseous abnormality or suspicious osseous lesion. IMPRESSION: VASCULAR Two sites of questionable contrast blush including a small focus of arterial enhancement at the level of a cecal diverticulum persisting on the venous phase of imaging. Could reflect bleeding at a noninflamed diverticulum versus artifact due to adjacent bowel gas. Second site is is a more serpentine focus of enhancement diffusing on the venous phase imaging at the level of the anorectal verge. Such an appearance is fairly typical for hemorrhoidal enhancement with small bleed less favored though could correlate with rectal exam. No other acute or conspicuous vascular findings. NON-VASCULAR Diffusely fluid-filled appearance of the colon, correlate for rapid transit state/diarrheal illness. Postsurgical changes in the right inguinal region with scarring about the right anterolateral bladder, correlate with surgical history for possible prior hernia repair. Prior vasectomy. Electronically Signed   By: Lovena Le M.D.   On: 05/14/2021 00:36    Subjective: Patient was seen and examined today.  No more rectal bleeding.  Able to eat and drink without any difficulty.  Wants to go home.  Discharge Exam: Vitals:   05/16/21 2020 05/17/21 0736  BP: 138/80 134/83  Pulse: 82 76   Resp: 20   Temp:  97.6 F (36.4 C)  SpO2: 100% 99%   Vitals:   05/16/21 0934 05/16/21 1602 05/16/21 2020 05/17/21 0736  BP: 117/79 129/85 138/80 134/83  Pulse: 71 80 82 76  Resp:  16 20   Temp:  98.4 F (36.9 C) 98.2 F (36.8 C) 97.6 F (36.4 C)  TempSrc:  Oral Oral Oral  SpO2:  99% 100% 99%  Weight:      Height:        General: Pt is alert, awake, not in acute distress Cardiovascular: RRR, S1/S2 +, no rubs, no gallops Respiratory: CTA bilaterally, no wheezing, no rhonchi Abdominal: Soft, NT, ND, bowel sounds + Extremities: no edema, no cyanosis   The results of significant diagnostics from this hospitalization (including imaging, microbiology, ancillary and laboratory) are listed below for reference.    Microbiology: Recent Results (from the past 240 hour(s))  Resp Panel by RT-PCR (Flu A&B, Covid) Nasopharyngeal Swab     Status: None   Collection Time: 05/14/21  1:09 AM   Specimen: Nasopharyngeal Swab; Nasopharyngeal(NP) swabs in vial transport medium  Result Value Ref Range Status   SARS Coronavirus 2 by RT PCR NEGATIVE NEGATIVE Final    Comment: (NOTE) SARS-CoV-2 target nucleic acids are NOT DETECTED.  The SARS-CoV-2 RNA is generally detectable in upper respiratory specimens during the acute phase of infection. The lowest concentration of SARS-CoV-2 viral copies this assay can detect is 138 copies/mL. A negative result does not preclude SARS-Cov-2 infection and should not be used as the sole basis for treatment or other patient management decisions. A negative result may occur with  improper specimen collection/handling, submission of specimen other than nasopharyngeal swab, presence of viral mutation(s) within the areas targeted by this assay, and inadequate number of viral copies(<138 copies/mL). A negative result must be combined with clinical observations, patient history, and epidemiological  information. The expected result is Negative.  Fact Sheet for  Patients:  EntrepreneurPulse.com.au  Fact Sheet for Healthcare Providers:  IncredibleEmployment.be  This test is no t yet approved or cleared by the [place] FDA and  has been authorized for detection and/or diagnosis of SARS-CoV-2 by FDA under an Emergency Use Authorization (EUA). This EUA will remain  in effect (meaning this test can be used) for the duration of the COVID-19 declaration under Section 564(b)(1) of the Act, 21 U.S.C.section 360bbb-3(b)(1), unless the authorization is terminated  or revoked sooner.       Influenza A by PCR NEGATIVE NEGATIVE Final   Influenza B by PCR NEGATIVE NEGATIVE Final    Comment: (NOTE) The Xpert Xpress SARS-CoV-2/FLU/RSV plus assay is intended as an aid in the diagnosis of influenza from Nasopharyngeal swab specimens and should not be used as a sole basis for treatment. Nasal washings and aspirates are unacceptable for Xpert Xpress SARS-CoV-2/FLU/RSV testing.  Fact Sheet for Patients: EntrepreneurPulse.com.au  Fact Sheet for Healthcare Providers: IncredibleEmployment.be  This test is not yet approved or cleared by the [place] FDA and has been authorized for detection and/or diagnosis of SARS-CoV-2 by FDA under an Emergency Use Authorization (EUA). This EUA will remain in effect (meaning this test can be used) for the duration of the COVID-19 declaration under Section 564(b)(1) of the Act, 21 U.S.C. section 360bbb-3(b)(1), unless the authorization is terminated or revoked.  Performed at [facility], [address] [place], [place] [address]      Labs: BNP (last 3 results) No results for input(s): BNP in the last 8760 hours. Basic Metabolic Panel: Recent Labs  Lab 05/13/21 2311 05/15/21 0415  NA 138 138  K 3.6 3.6  CL 107 109  CO2 26 26  GLUCOSE 113* 103*  BUN 17 10  CREATININE 0.95 0.87  CALCIUM 9.3 8.3*   Liver Function  Tests: Recent Labs  Lab 05/13/21 2311  AST 18  ALT 17  ALKPHOS 44  BILITOT 0.6  PROT 6.2*  ALBUMIN 3.9   No results for input(s): LIPASE, AMYLASE in the last 168 hours. No results for input(s): AMMONIA in the last 168 hours. CBC: Recent Labs  Lab 05/15/21 1135 05/15/21 1832 05/16/21 0401 05/16/21 1900 05/17/21 0350  WBC 5.8 6.3 5.5 6.9 7.6  HGB 13.9 12.5* 11.1* 10.7* 10.5*  HCT 40.7 36.5* 32.7* 30.0* 30.6*  MCV 95.5 93.8 92.4 93.8 92.4  PLT 261 269 246 261 277   Cardiac Enzymes: No results for input(s): CKTOTAL, CKMB, CKMBINDEX, TROPONINI in the last 168 hours. BNP: Invalid input(s): POCBNP CBG: No results for input(s): GLUCAP in the last 168 hours. D-Dimer No results for input(s): DDIMER in the last 72 hours. Hgb A1c No results for input(s): HGBA1C in the last 72 hours. Lipid Profile No results for input(s): CHOL, HDL, LDLCALC, TRIG, CHOLHDL, LDLDIRECT in the last 72 hours. Thyroid function studies No results for input(s): TSH, T4TOTAL, T3FREE, THYROIDAB in the last 72 hours.  Invalid input(s): FREET3 Anemia work up Recent Labs    05/17/21 0350  FERRITIN 143  TIBC 337  IRON 58   Urinalysis No results found for: COLORURINE, APPEARANCEUR, LABSPEC, [place], [place], [place], [place], [place], PROTEINUR, UROBILINOGEN, NITRITE, LEUKOCYTESUR Sepsis Labs Invalid input(s): PROCALCITONIN,  WBC,  LACTICIDVEN Microbiology Recent Results (from the past 240 hour(s))  Resp Panel by RT-PCR (Flu A&B, Covid) Nasopharyngeal Swab     Status: None   Collection Time: 05/14/21  1:09 AM   Specimen: Nasopharyngeal Swab; Nasopharyngeal(NP)  swabs in vial transport medium  Result Value Ref Range Status   SARS Coronavirus 2 by RT PCR NEGATIVE NEGATIVE Final    Comment: (NOTE) SARS-CoV-2 target nucleic acids are NOT DETECTED.  The SARS-CoV-2 RNA is generally detectable in upper respiratory specimens during the acute phase of infection. The lowest concentration of  SARS-CoV-2 viral copies this assay can detect is 138 copies/mL. A negative result does not preclude SARS-Cov-2 infection and should not be used as the sole basis for treatment or other patient management decisions. A negative result may occur with  improper specimen collection/handling, submission of specimen other than nasopharyngeal swab, presence of viral mutation(s) within the areas targeted by this assay, and inadequate number of viral copies(<138 copies/mL). A negative result must be combined with clinical observations, patient history, and epidemiological information. The expected result is Negative.  Fact Sheet for Patients:  EntrepreneurPulse.com.au  Fact Sheet for Healthcare Providers:  IncredibleEmployment.be  This test is no t yet approved or cleared by the [place] FDA and  has been authorized for detection and/or diagnosis of SARS-CoV-2 by FDA under an Emergency Use Authorization (EUA). This EUA will remain  in effect (meaning this test can be used) for the duration of the COVID-19 declaration under Section 564(b)(1) of the Act, 21 U.S.C.section 360bbb-3(b)(1), unless the authorization is terminated  or revoked sooner.       Influenza A by PCR NEGATIVE NEGATIVE Final   Influenza B by PCR NEGATIVE NEGATIVE Final    Comment: (NOTE) The Xpert Xpress SARS-CoV-2/FLU/RSV plus assay is intended as an aid in the diagnosis of influenza from Nasopharyngeal swab specimens and should not be used as a sole basis for treatment. Nasal washings and aspirates are unacceptable for Xpert Xpress SARS-CoV-2/FLU/RSV testing.  Fact Sheet for Patients: EntrepreneurPulse.com.au  Fact Sheet for Healthcare Providers: IncredibleEmployment.be  This test is not yet approved or cleared by the [place] FDA and has been authorized for detection and/or diagnosis of SARS-CoV-2 by FDA under an Emergency Use  Authorization (EUA). This EUA will remain in effect (meaning this test can be used) for the duration of the COVID-19 declaration under Section 564(b)(1) of the Act, 21 U.S.C. section 360bbb-3(b)(1), unless the authorization is terminated or revoked.  Performed at [facility], [address] [place],  [address]     Time coordinating discharge: Over 30 minutes  SIGNED:  Lorella Nimrod, MD  [facility] 05/17/2021, 12:37 PM  If 7PM-7AM, please contact night-coverage [email or web address]  This record has been created using Systems analyst. Errors have been sought and corrected,but may not always be located. Such creation errors do not reflect on the standard of care.

## 2021-05-24 ENCOUNTER — Encounter: Payer: Self-pay | Admitting: Gastroenterology

## 2021-05-25 ENCOUNTER — Telehealth: Payer: Self-pay

## 2021-05-25 NOTE — Telephone Encounter (Signed)
Called patient to let him know that Dr. Bonna Gains wanted me to reach out to him to schedule an appointment with Dr. Vicente Males for hemorrhoid banding. However, when I called patient, he stated that if we did not have all of his appointments within this month, then he would have to go elsewhere since his insurance will start his deductible starting September 1st. I told him that Dr. Vicente Males did not have an appointment until September. Therefore, patient stated that he would not schedule anything with [place] since he was not going to pay a high deductible due to hemorrhoid bandings. I told him that if he ever needed [place], to please give [place] a call. Patient agreed and had no further questions.

## 2021-05-25 NOTE — Telephone Encounter (Signed)
-----   Message from Virgel Manifold, MD sent at 05/21/2021  2:43 PM EDT ----- Please make clinic follow up appointment with Dr. Vicente Males as pt wanted to follow up with him to discuss hemorrhoid banding. Recently seen in the hospital.

## 2023-01-21 IMAGING — CT CT CTA ABD/PEL W/CM AND/OR W/O CM
3 of 10 series · 9 of 46 positions shown, 15 images · IV contrast (omnipaque)
Comparison: None.

CLINICAL DATA: GI bleeding, bilateral lower abdominal pain

EXAM:
CTA ABDOMEN AND PELVIS WITHOUT AND WITH CONTRAST
TECHNIQUE: Multidetector CT imaging of the abdomen and pelvis was performed
using the standard protocol during bolus administration of
intravenous contrast. Multiplanar reconstructed images and MIPs were
obtained and reviewed to evaluate the vascular anatomy.
CONTRAST:  100mL OMNIPAQUE IOHEXOL 350 MG/ML SOLN

[Series 5: axial arterial · axial · arterial · 0.92mm/px · z∈[-1037,-887]mm · 3 of 280 slices shown]
[im 19/280  soft-tissue]
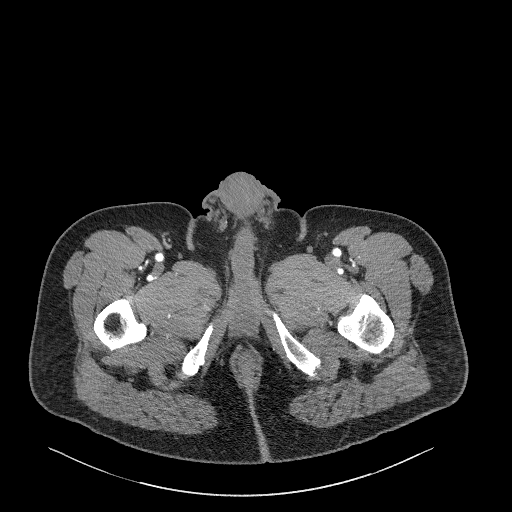
[im 56/280  soft-tissue]
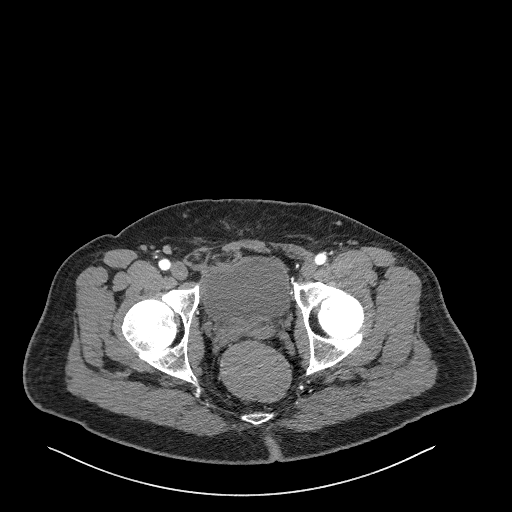
[im 94/280  soft-tissue]
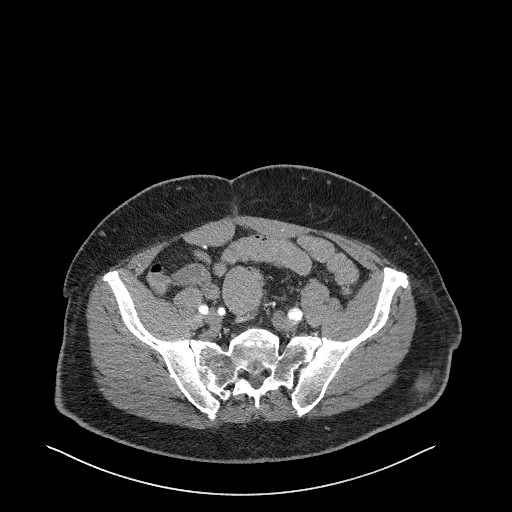

[Series 6: axial venous · axial · portal-venous · 0.92mm/px · z∈[-955,-625]mm · 4 of 111 slices shown, 9 images]
[im 23/111  soft-tissue]
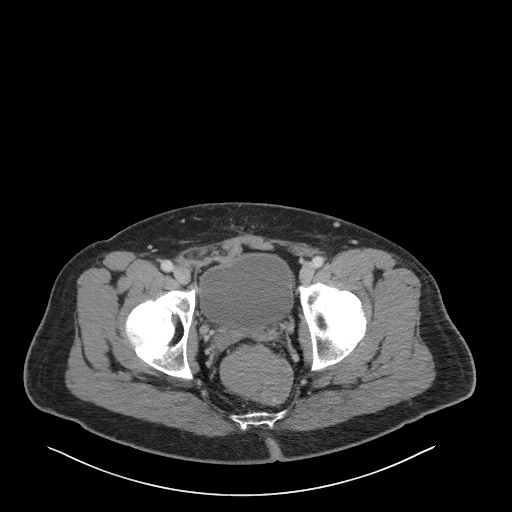
[im 23/111  lung]
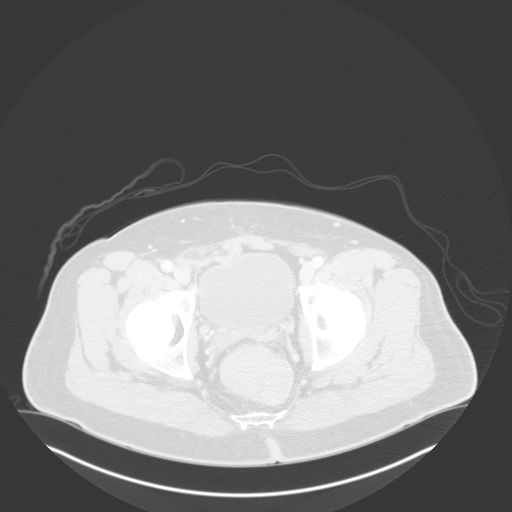
[im 23/111  bone]
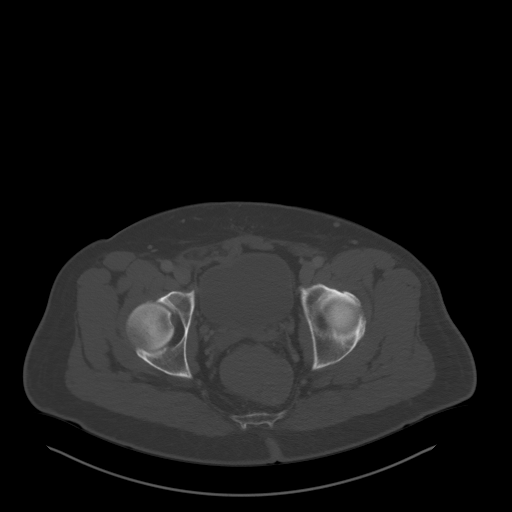
[im 45/111  soft-tissue]
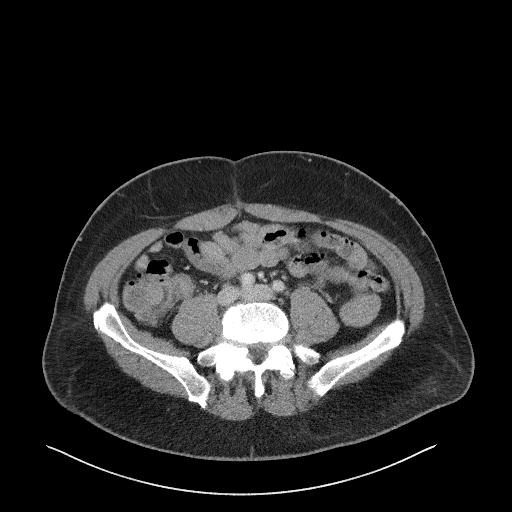
[im 45/111  lung]
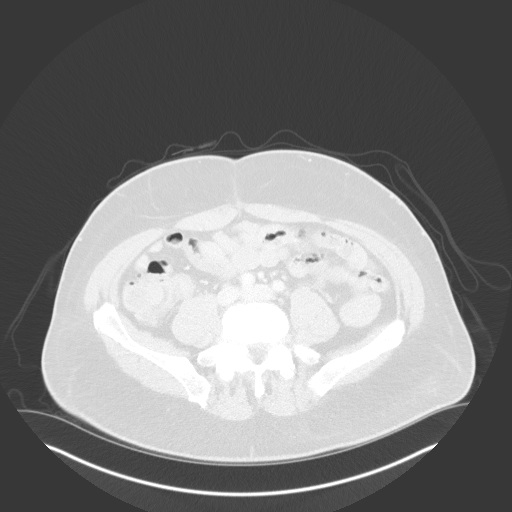
[im 67/111  soft-tissue]
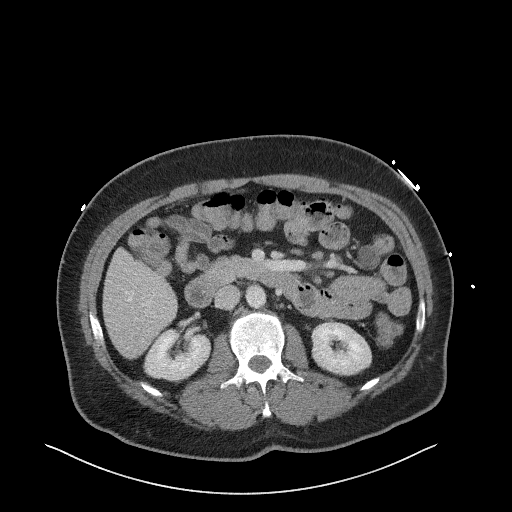
[im 67/111  lung]
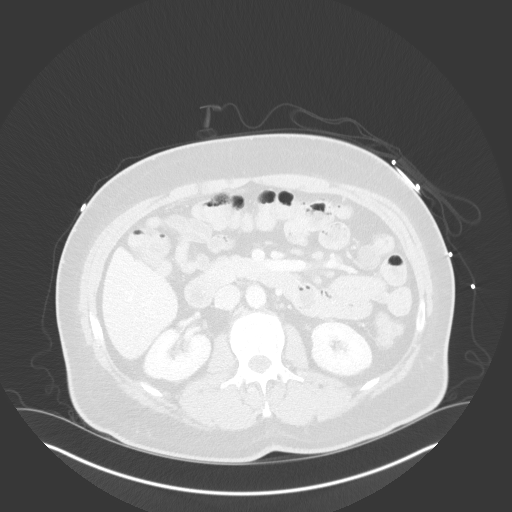
[im 89/111  soft-tissue]
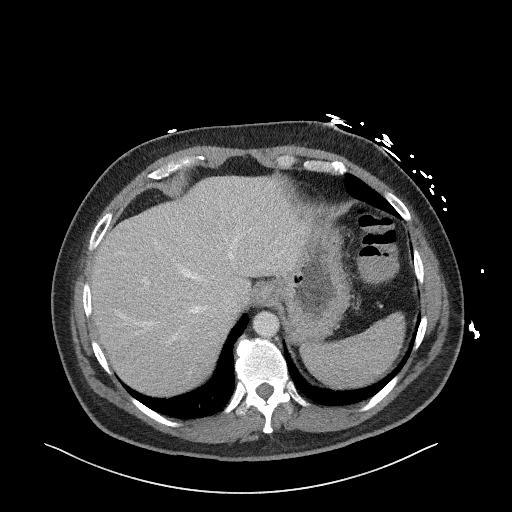
[im 89/111  lung]
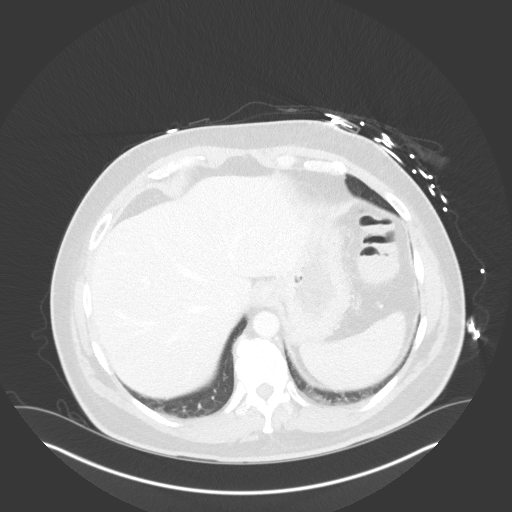

[Series 9: coronal mpr · coronal · 0.85mm/px · 2 of 144 slices shown, 3 images]
[im 48/144  soft-tissue]
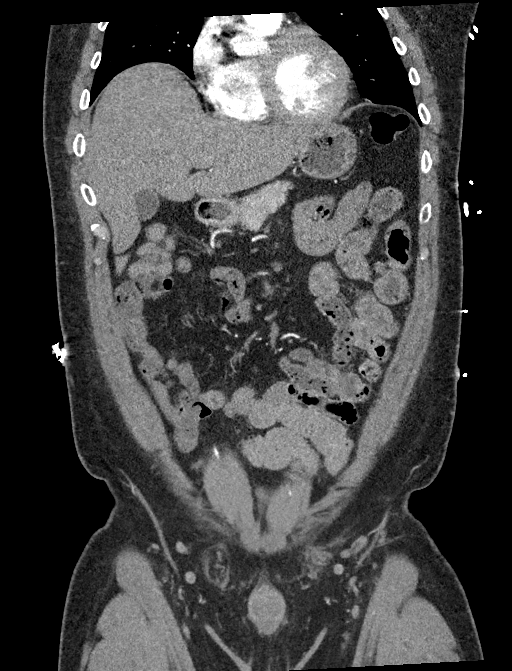
[im 48/144  bone]
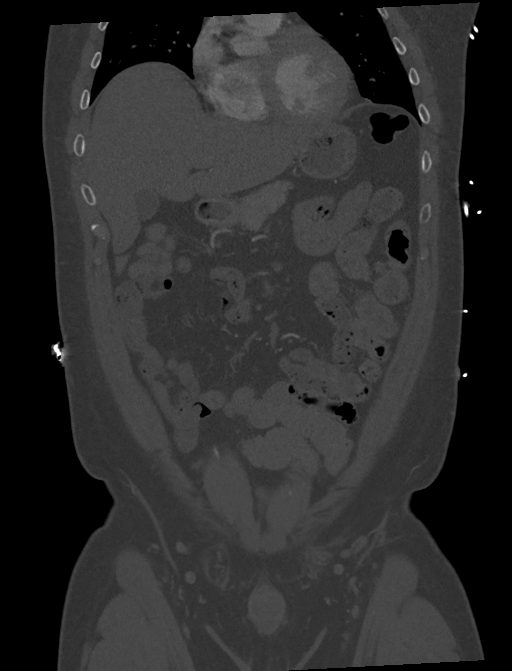
[im 96/144  soft-tissue]
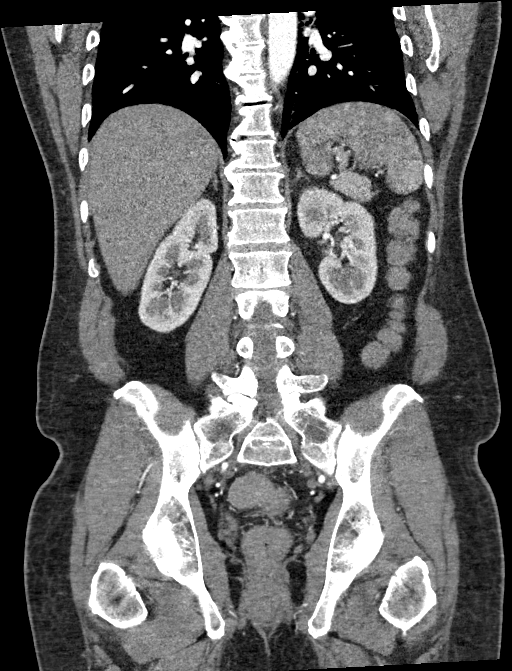

[9 of 46 positions shown; findings below may reference images not displayed]

FINDINGS: VASCULAR

Aorta: Normal caliber aorta without aneurysm, dissection, vasculitis
or significant stenosis.

Celiac: Patent without evidence of aneurysm, dissection, vasculitis
or significant stenosis.

SMA: Patent. No evidence of aneurysm, dissection or vasculitis. No
flow-limiting stenosis or occlusion.

Renals: Single renal arteries bilaterally. Both renal arteries are
patent without evidence of aneurysm, dissection, vasculitis,
fibromuscular dysplasia or significant stenosis.

IMA: Vessel normally opacified. No acute luminal abnormality. No
significant stenosis or occlusion. No aneurysm or ectasia or
features of vasculitis.

Inflow: Patent without evidence of aneurysm, dissection, vasculitis
or significant stenosis.

Proximal Outflow: Bilateral common femoral and visualized portions
of the superficial and profunda femoral arteries are patent without
evidence of aneurysm, dissection, vasculitis or significant
stenosis.

Veins: No obvious venous abnormality within the limitations of this
arterial phase study.

Review of the MIP images confirms the above findings.

NON-VASCULAR

Lower chest: Atelectatic changes in the otherwise clear lung bases.

Hepatobiliary: No worrisome focal liver lesions. Smooth liver
surface contour. Normal hepatic attenuation. Normal gallbladder and
biliary tree.

Pancreas: No pancreatic ductal dilatation or surrounding
inflammatory changes.

Spleen: Normal in size. No concerning splenic lesions.

Adrenals/Urinary Tract: Normal adrenal glands. Kidneys are normally
located with symmetric enhancement. No suspicious renal lesion,
urolithiasis or hydronephrosis. Postsurgical changes and indentation
of the right anterolateral bladder, likely related to a right
inguinal hernia repair.

Stomach/Bowel: Distal esophagus, stomach and duodenal sweep are
unremarkable. No small bowel wall thickening or dilatation. Few
small distal ileal noninflamed diverticula. Small focus of arterial
contrast enhancement associated with the right-sided cecal
diverticulum (9/62) which appears stable on the more delayed venous
phase imaging, could reflect enhancement or minimal extravasation
versus artifact due to adjacent bowel gas. Additional numerous
colonic diverticula throughout the colonic segments without
worrisome focal Peri diverticular inflammation colonic thickening or
dilatation. Fluid-filled appearance of the colon. Serpentine
hyperattenuation towards the level of the anorectal verge on the
arterial phase of imaging (9/98) appears less pronounced on the
venous phase imaging. Appearance suggestive of hemorrhoidal
enhancement with small site of extravasation less favored.

Lymphatic: No significant vascular findings are present. No enlarged
abdominal or pelvic lymph nodes.

Reproductive: Vasectomy clips noted bilaterally. The prostate and
seminal vesicles are unremarkable.

Other: Postsurgical changes in the right inguinal region, possible
hernia repair. Additional vertical midline incision anteriorly.
Focal subcutaneous nodular thickening in the left flank, could
reflect contusive change or injectable use.

Musculoskeletal: Multilevel degenerative changes are present in the
imaged portions of the spine, hips and pelvis. No acute osseous
abnormality or suspicious osseous lesion.
IMPRESSION: VASCULAR

Two sites of questionable contrast blush including a small focus of
arterial enhancement at the level of a cecal diverticulum persisting
on the venous phase of imaging. Could reflect bleeding at a
noninflamed diverticulum versus artifact due to adjacent bowel gas.

Second site is is a more serpentine focus of enhancement diffusing
on the venous phase [[facility]OSPITAL] the level of the anorectal verge.
Such an appearance is fairly typical for hemorrhoidal enhancement
with small bleed less favored though could correlate with rectal
exam.

No other acute or conspicuous vascular findings.

NON-VASCULAR

Diffusely fluid-filled appearance of the colon, correlate for rapid
transit state/diarrheal illness.

Postsurgical changes in the right inguinal region with scarring
about the right anterolateral bladder, correlate with surgical
history for possible prior hernia repair.

Prior vasectomy.

## 2023-01-22 IMAGING — NM NM GI BLOOD LOSS
2 series · 12 of 12 positions shown · non-contrast
Comparison: Abdominal CTA yesterday.

CLINICAL DATA: Lower GI bleed.

EXAM:
NUCLEAR MEDICINE GASTROINTESTINAL BLEEDING SCAN
TECHNIQUE: Sequential abdominal images were obtained following intravenous
administration of Lc-YYm labeled red blood cells.
RADIOPHARMACEUTICALS:  21.72 mCi Lc-YYm pertechnetate in-vitro
labeled red cells.

[Series 1000: gi bleed hour 2 · 4.80mm/px · 6 of 60 frames shown]
[frame 6/60]
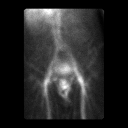
[frame 16/60]
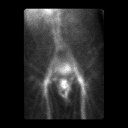
[frame 26/60]
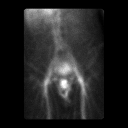
[frame 36/60]
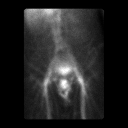
[frame 46/60]
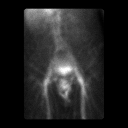
[frame 56/60]
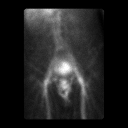

[Series 1000: hour 1 gi bleed · 4.80mm/px · 6 of 60 frames shown]
[frame 6/60]
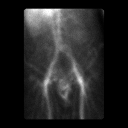
[frame 16/60]
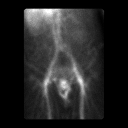
[frame 26/60]
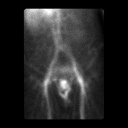
[frame 36/60]
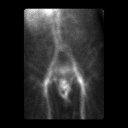
[frame 46/60]
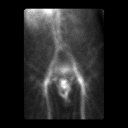
[frame 56/60]
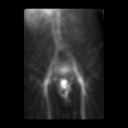

[12 of 12 positions shown; findings below may reference images not displayed]

FINDINGS: There is no accumulation of radiotracer in the GI tract to localize
site of GI bleed. Particularly, no right lower quadrant accumulation
of tracer at site of question bleed on CTA. Physiologic distribution
of radiotracer including the urinary bladder and genitalia.
IMPRESSION: No scintigraphic localization of GI bleed.
